# Patient Record
Sex: Female | Born: 1965 | Race: White | Hispanic: No | Marital: Married | Smoking: Never smoker
Health system: Southern US, Community
[De-identification: ages and names within clinical notes are randomized; demographics above are authoritative.]

## PROBLEM LIST (undated history)

## (undated) DIAGNOSIS — I10 Essential (primary) hypertension: Secondary | ICD-10-CM

## (undated) DIAGNOSIS — R112 Nausea with vomiting, unspecified: Secondary | ICD-10-CM

## (undated) DIAGNOSIS — K6389 Other specified diseases of intestine: Secondary | ICD-10-CM

## (undated) DIAGNOSIS — K219 Gastro-esophageal reflux disease without esophagitis: Secondary | ICD-10-CM

## (undated) DIAGNOSIS — I499 Cardiac arrhythmia, unspecified: Secondary | ICD-10-CM

## (undated) DIAGNOSIS — G43909 Migraine, unspecified, not intractable, without status migrainosus: Secondary | ICD-10-CM

## (undated) DIAGNOSIS — K5909 Other constipation: Secondary | ICD-10-CM

## (undated) DIAGNOSIS — I729 Aneurysm of unspecified site: Secondary | ICD-10-CM

## (undated) DIAGNOSIS — K648 Other hemorrhoids: Secondary | ICD-10-CM

## (undated) DIAGNOSIS — Z9889 Other specified postprocedural states: Secondary | ICD-10-CM

## (undated) HISTORY — PX: EYE SURGERY: SHX253

## (undated) HISTORY — DX: Aneurysm of unspecified site: I72.9

## (undated) HISTORY — PX: OTHER SURGICAL HISTORY: SHX169

## (undated) HISTORY — PX: COLONOSCOPY: SHX174

## (undated) HISTORY — DX: Gastro-esophageal reflux disease without esophagitis: K21.9

## (undated) HISTORY — PX: APPENDECTOMY: SHX54

## (undated) HISTORY — DX: Other constipation: K59.09

## (undated) HISTORY — DX: Migraine, unspecified, not intractable, without status migrainosus: G43.909

## (undated) HISTORY — PX: TUBAL LIGATION: SHX77

## (undated) HISTORY — DX: Other hemorrhoids: K64.8

## (undated) HISTORY — PX: UPPER GASTROINTESTINAL ENDOSCOPY: SHX188

---

## 2001-04-18 ENCOUNTER — Ambulatory Visit (HOSPITAL_COMMUNITY): Admission: RE | Admit: 2001-04-18 | Discharge: 2001-04-18 | Payer: Self-pay | Admitting: Internal Medicine

## 2001-04-18 ENCOUNTER — Encounter: Payer: Self-pay | Admitting: Internal Medicine

## 2001-04-25 ENCOUNTER — Encounter: Payer: Self-pay | Admitting: Internal Medicine

## 2001-04-25 ENCOUNTER — Ambulatory Visit (HOSPITAL_COMMUNITY): Admission: RE | Admit: 2001-04-25 | Discharge: 2001-04-25 | Payer: Self-pay | Admitting: Internal Medicine

## 2001-05-04 ENCOUNTER — Emergency Department (HOSPITAL_COMMUNITY): Admission: EM | Admit: 2001-05-04 | Discharge: 2001-05-04 | Payer: Self-pay | Admitting: Internal Medicine

## 2001-05-04 ENCOUNTER — Encounter: Payer: Self-pay | Admitting: Internal Medicine

## 2001-06-05 ENCOUNTER — Encounter: Payer: Self-pay | Admitting: Internal Medicine

## 2001-06-05 ENCOUNTER — Ambulatory Visit (HOSPITAL_COMMUNITY): Admission: RE | Admit: 2001-06-05 | Discharge: 2001-06-05 | Payer: Self-pay | Admitting: Internal Medicine

## 2001-06-22 ENCOUNTER — Ambulatory Visit (HOSPITAL_COMMUNITY): Admission: RE | Admit: 2001-06-22 | Discharge: 2001-06-22 | Payer: Self-pay | Admitting: Internal Medicine

## 2001-06-22 ENCOUNTER — Encounter: Payer: Self-pay | Admitting: Internal Medicine

## 2001-07-26 ENCOUNTER — Ambulatory Visit (HOSPITAL_COMMUNITY): Admission: RE | Admit: 2001-07-26 | Discharge: 2001-07-26 | Payer: Self-pay | Admitting: Specialist

## 2001-07-26 ENCOUNTER — Encounter: Payer: Self-pay | Admitting: Specialist

## 2001-07-26 ENCOUNTER — Other Ambulatory Visit: Admission: RE | Admit: 2001-07-26 | Discharge: 2001-07-26 | Payer: Self-pay | Admitting: Obstetrics and Gynecology

## 2002-02-01 ENCOUNTER — Observation Stay (HOSPITAL_COMMUNITY): Admission: RE | Admit: 2002-02-01 | Discharge: 2002-02-02 | Payer: Self-pay | Admitting: Obstetrics and Gynecology

## 2003-09-09 ENCOUNTER — Emergency Department (HOSPITAL_COMMUNITY): Admission: EM | Admit: 2003-09-09 | Discharge: 2003-09-09 | Payer: Self-pay | Admitting: Emergency Medicine

## 2004-09-25 ENCOUNTER — Ambulatory Visit (HOSPITAL_COMMUNITY): Admission: RE | Admit: 2004-09-25 | Discharge: 2004-09-25 | Payer: Self-pay | Admitting: Internal Medicine

## 2004-09-25 ENCOUNTER — Ambulatory Visit: Payer: Self-pay | Admitting: *Deleted

## 2004-10-26 ENCOUNTER — Ambulatory Visit (HOSPITAL_COMMUNITY): Admission: RE | Admit: 2004-10-26 | Discharge: 2004-10-27 | Payer: Self-pay | Admitting: Internal Medicine

## 2004-10-28 ENCOUNTER — Ambulatory Visit: Payer: Self-pay | Admitting: *Deleted

## 2005-02-26 ENCOUNTER — Ambulatory Visit (HOSPITAL_COMMUNITY): Admission: RE | Admit: 2005-02-26 | Discharge: 2005-02-26 | Payer: Self-pay | Admitting: Ophthalmology

## 2005-02-26 ENCOUNTER — Ambulatory Visit (HOSPITAL_BASED_OUTPATIENT_CLINIC_OR_DEPARTMENT_OTHER): Admission: RE | Admit: 2005-02-26 | Discharge: 2005-02-26 | Payer: Self-pay | Admitting: Ophthalmology

## 2006-12-21 ENCOUNTER — Encounter: Admission: RE | Admit: 2006-12-21 | Discharge: 2006-12-21 | Payer: Self-pay | Admitting: Occupational Medicine

## 2007-08-24 ENCOUNTER — Ambulatory Visit (HOSPITAL_COMMUNITY): Admission: RE | Admit: 2007-08-24 | Discharge: 2007-08-24 | Payer: Self-pay | Admitting: Internal Medicine

## 2007-10-06 ENCOUNTER — Encounter: Admission: RE | Admit: 2007-10-06 | Discharge: 2007-10-06 | Payer: Self-pay | Admitting: Neurology

## 2007-10-24 ENCOUNTER — Encounter: Payer: Self-pay | Admitting: Neurology

## 2007-11-02 ENCOUNTER — Ambulatory Visit (HOSPITAL_COMMUNITY): Admission: RE | Admit: 2007-11-02 | Discharge: 2007-11-02 | Payer: Self-pay | Admitting: Interventional Radiology

## 2007-11-17 ENCOUNTER — Encounter: Payer: Self-pay | Admitting: Interventional Radiology

## 2007-12-14 ENCOUNTER — Inpatient Hospital Stay (HOSPITAL_COMMUNITY): Admission: RE | Admit: 2007-12-14 | Discharge: 2007-12-15 | Payer: Self-pay | Admitting: Interventional Radiology

## 2007-12-28 ENCOUNTER — Encounter: Payer: Self-pay | Admitting: Interventional Radiology

## 2008-01-11 ENCOUNTER — Encounter: Admission: RE | Admit: 2008-01-11 | Discharge: 2008-01-11 | Payer: Self-pay | Admitting: Internal Medicine

## 2008-04-26 ENCOUNTER — Ambulatory Visit (HOSPITAL_COMMUNITY): Admission: RE | Admit: 2008-04-26 | Discharge: 2008-04-26 | Payer: Self-pay | Admitting: Interventional Radiology

## 2009-03-22 ENCOUNTER — Emergency Department (HOSPITAL_COMMUNITY): Admission: EM | Admit: 2009-03-22 | Discharge: 2009-03-22 | Payer: Self-pay | Admitting: Emergency Medicine

## 2009-03-28 ENCOUNTER — Ambulatory Visit (HOSPITAL_COMMUNITY): Admission: RE | Admit: 2009-03-28 | Discharge: 2009-03-28 | Payer: Self-pay | Admitting: Interventional Radiology

## 2009-09-02 ENCOUNTER — Encounter (INDEPENDENT_AMBULATORY_CARE_PROVIDER_SITE_OTHER): Payer: Self-pay | Admitting: *Deleted

## 2009-09-26 ENCOUNTER — Ambulatory Visit: Payer: Self-pay | Admitting: Gastroenterology

## 2009-09-26 ENCOUNTER — Encounter: Payer: Self-pay | Admitting: Gastroenterology

## 2009-09-26 DIAGNOSIS — K625 Hemorrhage of anus and rectum: Secondary | ICD-10-CM | POA: Insufficient documentation

## 2009-09-26 DIAGNOSIS — K5900 Constipation, unspecified: Secondary | ICD-10-CM | POA: Insufficient documentation

## 2009-10-01 ENCOUNTER — Encounter: Payer: Self-pay | Admitting: Gastroenterology

## 2009-10-14 ENCOUNTER — Ambulatory Visit: Payer: Self-pay | Admitting: Gastroenterology

## 2009-10-14 ENCOUNTER — Ambulatory Visit (HOSPITAL_COMMUNITY): Admission: RE | Admit: 2009-10-14 | Discharge: 2009-10-14 | Payer: Self-pay | Admitting: Gastroenterology

## 2009-11-18 ENCOUNTER — Ambulatory Visit: Payer: Self-pay | Admitting: Gastroenterology

## 2009-11-19 DIAGNOSIS — K649 Unspecified hemorrhoids: Secondary | ICD-10-CM | POA: Insufficient documentation

## 2009-12-08 ENCOUNTER — Encounter: Payer: Self-pay | Admitting: Urgent Care

## 2009-12-31 ENCOUNTER — Ambulatory Visit (HOSPITAL_COMMUNITY): Admission: RE | Admit: 2009-12-31 | Discharge: 2009-12-31 | Payer: Self-pay | Admitting: Internal Medicine

## 2010-01-06 ENCOUNTER — Encounter: Payer: Self-pay | Admitting: Interventional Radiology

## 2010-01-07 ENCOUNTER — Ambulatory Visit (HOSPITAL_COMMUNITY): Admission: RE | Admit: 2010-01-07 | Discharge: 2010-01-07 | Payer: Self-pay | Admitting: Interventional Radiology

## 2010-01-27 ENCOUNTER — Encounter: Admission: RE | Admit: 2010-01-27 | Discharge: 2010-01-27 | Payer: Self-pay | Admitting: Family Medicine

## 2010-04-09 ENCOUNTER — Ambulatory Visit (HOSPITAL_COMMUNITY): Admission: RE | Admit: 2010-04-09 | Discharge: 2010-04-09 | Payer: Self-pay | Admitting: Neurology

## 2010-07-13 ENCOUNTER — Ambulatory Visit (HOSPITAL_COMMUNITY): Admission: RE | Admit: 2010-07-13 | Discharge: 2010-07-13 | Payer: Self-pay | Admitting: Emergency Medicine

## 2010-07-20 ENCOUNTER — Telehealth (INDEPENDENT_AMBULATORY_CARE_PROVIDER_SITE_OTHER): Payer: Self-pay

## 2010-07-24 ENCOUNTER — Ambulatory Visit: Payer: Self-pay | Admitting: Gastroenterology

## 2010-07-24 ENCOUNTER — Encounter: Payer: Self-pay | Admitting: Gastroenterology

## 2010-07-24 DIAGNOSIS — R932 Abnormal findings on diagnostic imaging of liver and biliary tract: Secondary | ICD-10-CM | POA: Insufficient documentation

## 2010-07-24 DIAGNOSIS — R109 Unspecified abdominal pain: Secondary | ICD-10-CM | POA: Insufficient documentation

## 2010-07-24 DIAGNOSIS — D1803 Hemangioma of intra-abdominal structures: Secondary | ICD-10-CM | POA: Insufficient documentation

## 2010-07-28 ENCOUNTER — Ambulatory Visit (HOSPITAL_COMMUNITY): Admission: RE | Admit: 2010-07-28 | Discharge: 2010-07-28 | Payer: Self-pay | Admitting: Gastroenterology

## 2010-07-30 LAB — CONVERTED CEMR LAB
ALT: 20 units/L (ref 0–35)
AST: 15 units/L (ref 0–37)
Basophils Relative: 0 % (ref 0–1)
Bilirubin, Direct: 0.1 mg/dL (ref 0.0–0.3)
Creatinine, Ser: 0.8 mg/dL (ref 0.40–1.20)
Eosinophils Absolute: 0.1 10*3/uL (ref 0.0–0.7)
Hemoglobin: 13.3 g/dL (ref 12.0–15.0)
Indirect Bilirubin: 0.3 mg/dL (ref 0.0–0.9)
Lymphs Abs: 1.2 10*3/uL (ref 0.7–4.0)
MCHC: 32.5 g/dL (ref 30.0–36.0)
MCV: 92.1 fL (ref 78.0–100.0)
Monocytes Absolute: 0.5 10*3/uL (ref 0.1–1.0)
Monocytes Relative: 8 % (ref 3–12)
Neutrophils Relative %: 70 % (ref 43–77)
RBC: 4.44 M/uL (ref 3.87–5.11)
Total Protein: 6.8 g/dL (ref 6.0–8.3)
WBC: 6.2 10*3/uL (ref 4.0–10.5)

## 2010-07-31 ENCOUNTER — Encounter: Payer: Self-pay | Admitting: Gastroenterology

## 2010-08-07 ENCOUNTER — Ambulatory Visit (HOSPITAL_COMMUNITY): Admission: RE | Admit: 2010-08-07 | Discharge: 2010-08-07 | Payer: Self-pay | Admitting: Gastroenterology

## 2010-08-07 ENCOUNTER — Ambulatory Visit: Payer: Self-pay | Admitting: Gastroenterology

## 2010-08-14 ENCOUNTER — Telehealth (INDEPENDENT_AMBULATORY_CARE_PROVIDER_SITE_OTHER): Payer: Self-pay

## 2010-11-17 NOTE — Letter (Signed)
Summary: EGD ORDER  EGD ORDER   Imported By: Ave Filter 07/31/2010 08:47:46  _____________________________________________________________________  External Attachment:    Type:   Image     Comment:   External Document

## 2010-11-17 NOTE — Assessment & Plan Note (Signed)
Summary: ABD PAIN,ABD DISTENTION,GASTRITIS,HEPATIC LESION/CM   Visit Type:  consult Referring Provider:  [facility] in [place] Primary Care Provider:  Carylon Perches  Chief Complaint:  abd pain and swelling x 2 weeks.  History of Present Illness: [name] is a pleasant 45 y/o female, established patient, who presents at request of [facility] for further evaluation of abd pain and abnormal abd u/s.   Started having epigastric pain few weeks ago when on steroids for ear infection. When completed, abd pain continued. Went to urgent care. Started on Prilosec and Carafate. Then added Bentyl and Ranitidine. When takes medications, feels little better, but watching everything she eats, bland soft diet. Abd pain worse with meals, greasy foods, dairy products. Some nausea. Was having bad acid reflux, burning into throat, regurgitation but all better on new meds. Some belching. BM regular on fiber supplement (daily) and Miralax (most days). Weight down 3-4 pounds in last week.   Abd U/S 9/11-->abnormal gb with mildly thickened wall and either septations or redundant mucosa, with similar findings on 2002 exam. 3.8X2.7X4.5cm lobulated hyperechoic mass at posterior aspect right lobe liver, ?hemangioma, but significant increase in size vs similar lesion described on report from 2002 exam. Radiologist recommends MRI liver/abd with and without contrast. Labs 07/11/10: lipase 160H, amylase 42, Tbili 0.5, AP 72, AST 23.  Current Medications (verified): 1)  Amitriptyline Hcl 25 Mg Tabs (Amitriptyline Hcl) .... Qid 2)  Topamax 25 Mg Tabs (Topiramate) .... Take 3 Tablets Daily 3)  Asa 81 Mg .... One Tablet Every Other Day 4)  Prilosec 20 Mg Cpdr (Omeprazole) .... Two Times A Day 5)  Ranitidine Hcl 150 Mg Caps (Ranitidine Hcl) .... Two Times A Day 6)  Bentyl 10 Mg Caps (Dicyclomine Hcl) .... Q 6 Hours As Needed 7)  Carafate 1 Gm/70ml Susp (Sucralfate) .... 2 Tsp Qid 8)  Flonase 50  Mcg/act Susp (Fluticasone Propionate) .... At Night 9)  Polyethylene Glycol 3350  Powd (Polyethylene Glycol 3350) .Marland KitchenMarland KitchenMarland Kitchen 17 Grams By Mouth Daily 10)  Fiber Supplement .... Once Daily  Allergies (verified): No Known Drug Allergies  Past History:  Past Medical History: Migraines/SINUS HA Aneurysm-s/p coiling 2009 TCS/TI, 12/10--> Normal terminal ileum approximately 25 cm visualized. Tortuous colon.  Moderate internal hemorrhoids.   Past Surgical History: Tubal Ligation Appendectomy at same time of tubal ligation  Family History: No FH of Colon Cancer, IBD or polyps. Has a cousin with colitis.  Family History of Breast Cancer: cousin Family History of Ovarian Cancer: no Family History of Uterine Cancer: no No FH of liver disease. Mother and sister had cholecystectomy.  Social History: Reviewed history from 09/26/2009 and no changes required. Married: 32 y, 1 kid: 9 daughter Occupation: Technical brewer Patient has never smoked.  Alcohol Use - very, very rare No recreational drug use  Review of Systems General:  Complains of weight loss; denies fever, chills, sweats, anorexia, fatigue, and weakness. Eyes:  Denies vision loss. ENT:  Denies nasal congestion, sore throat, hoarseness, and difficulty swallowing. CV:  Denies chest pains, angina, palpitations, dyspnea on exertion, and peripheral edema. Resp:  Denies dyspnea at rest, dyspnea with exercise, cough, and sputum. GI:  See HPI. GU:  Denies urinary burning and blood in urine. MS:  Denies joint pain / LOM. Derm:  Denies rash and itching. Neuro:  Denies weakness, frequent headaches, memory loss, and confusion. Psych:  Denies depression and anxiety. Endo:  Denies unusual weight change. Heme:  Denies bruising and bleeding. Allergy:  Denies  hives and rash.  Vital Signs:  Patient profile:   45 year old female Height:      65 inches Weight:      159 pounds BMI:     26.55 Temp:     98.5 degrees F oral Pulse rate:   80 /  minute BP sitting:   104 / 72  (left arm) Cuff size:   regular  Vitals Entered By: Hendricks Limes LPN (July 24, 2010 11:40 AM)  Physical Exam  General:  Well developed, well nourished, no acute distress. Head:  Normocephalic and atraumatic. Eyes:  Conjunctivae pink, no scleral icterus.  Mouth:  Oropharyngeal mucosa moist, pink.  No lesions, erythema or exudate.    Neck:  Supple; no masses or thyromegaly. Lungs:  Clear throughout to auscultation. Heart:  Regular rate and rhythm; no murmurs, rubs,  or bruits. Abdomen:  Soft. Positive BS. Moderate epigastric tenderness. No rebound or guarding. No HSM or masses. NO abd bruit or hernia.  Extremities:  No clubbing, cyanosis, edema or deformities noted. Neurologic:  Alert and  oriented x4;  grossly normal neurologically. Skin:  Intact without significant lesions or rashes. Cervical Nodes:  No significant cervical adenopathy. Psych:  Alert and cooperative. Normal mood and affect.  Impression & Recommendations:  Problem # 1:  ABDOMINAL PAIN (ICD-789.00)  ?biliary pancreatitis given bump in lipase and ?gb sludge on abd u/s. She has abnormal but potentially stable appearing gb based on report from abd u/s in 2002. At that time, she also had CT and HIDA and nothing conclusive to warrant cholecystectomy. Abd pain could be secondary to gastritis or PUD from steroids but would suspect that to be better on PPI.   Recommend labs, including lipase, amylase, lfts, cbc. Will look at gb further at time of MRI which is being done to look at liver lesion. Also have biliary system evaluated.  Continue Bentyl for now.   Orders: T-Lipase [id number]) T-Amylase [id number]) T-CBC w/Diff [id number]) T-Hepatic Function [id number])  Problem # 2:  LIVER MASS (ICD-235.3)  Liver lesion dating back to at least 2002. At that time, CT showed hemangioma measuring 2cm. Doubled in size now. Per radiologist's recommendation with obtain MRI liver/abd.    Orders: Consultation Level III [id number])  Other Orders: T-Creatinine Blood ([id number]) Prescriptions: BENTYL 10 MG CAPS (DICYCLOMINE HCL) 1-2 every six hours as needed abd pain  #120 x 0   Entered and Authorized by:   Leanna Battles. Dixon Boos   Signed by:   Leanna Battles Lewis PA-C on 07/24/2010   Method used:   Electronically to        [facility]  [address] (retail)       [address]       [place], [place]  [address]       Ph: [phone]       Fax: [phone]   RxID:   [id number]  I would like to thank [facility] for allowing [place] to take part in the care of this nice patient.   Appended Document: ABD PAIN,ABD DISTENTION,GASTRITIS,HEPATIC LESION/CM SEP 2011 159 LBS

## 2010-11-17 NOTE — Medication Information (Signed)
Summary: Tax adviser   Imported By: Diana Eves 12/08/2009 08:55:52  _____________________________________________________________________  External Attachment:    Type:   Image     Comment:   External Document  Appended Document: RX Folder Let pharm know anucort ok please.  Appended Document: RX Folder Informed Annabelle Harman at the pharmacy.

## 2010-11-17 NOTE — Assessment & Plan Note (Signed)
Summary: RECTAL BLEEDING/CONSTIPATION   Visit Type:  Follow-up Visit Primary Care Provider:  Ouida Sills, M.D.  Chief Complaint:  F/U rectal bleeding/constipation.  History of Present Illness: Pt not taking meds regularly. Getting under control then treated for sinus infection and got a yeast infection. Bleeding from rectum: 1-2x/week. Not gobbs just on the tissue. BM:1-2x/sday. Going every day with two times a day fiber.    Current Medications (verified): 1)  Amitriptyline Hcl 25 Mg Tabs (Amitriptyline Hcl) .... Take 1 Tablet By Mouth Once A Day 2)  Topamax 25 Mg Tabs (Topiramate) .... Take Two Tablets Daily 3)  Asa 81 Mg .... One Tablet Every Other Day 4)  Anucort-Hc 25 Mg Supp (Hydrocortisone Acetate) .... One Pr Every 12 Hours For 14 Days  Allergies (verified): No Known Drug Allergies  Past History:  Past Medical History: Last updated: 09/26/2009 Migraines/SINUS HA Aneurysm-s/p coiling 2009  Past Surgical History: Last updated: 09/26/2009 Tubal Ligation  Vital Signs:  Patient profile:   45 year old female Height:      65 inches Weight:      152 pounds BMI:     25.39 Temp:     98.1 degrees F oral Pulse rate:   64 / minute BP sitting:   120 / 82  (left arm) Cuff size:   regular  Vitals Entered By: Cloria Spring LPN (November 18, 2009 4:14 PM)  Physical Exam  General:  Well developed, well nourished, no acute distress. Head:  Normocephalic and atraumatic. Lungs:  Clear throughout to auscultation. Heart:  Regular rate and rhythm; no murmurs Abdomen:  Soft, nontender and nondistended. Normal bowel sounds. Extremities:  No cyanosis, edema or deformities noted. Neurologic:  Alert and  oriented x4;  grossly normal neurologically.  Impression & Recommendations:  Problem # 1:  CONSTIPATION (ICD-564.00) Assessment Improved Discussed TCS result. Take MVI daily-[place] chewable vitamins.  Problem # 2:  RECTAL BLEEDING (ICD-569.3) Assessment: Improved but persists.  Anusol HC for 14 days. Call me in one month. If not, Dr. Cherylann Ratel for hemorrhoidectomy. Pt declined to address via endoscopy. OPV in 6 mos.  CC: PCP  Other Orders: Est. Patient Level III ([address])

## 2010-11-17 NOTE — Progress Notes (Signed)
Summary: phone note/ wants earlier appt.  Phone Note From Other Clinic   Caller: Lupita Leash @ [facility] [address] Summary of Call: Received a phone call from Lupita Leash at the [facility] in [place]. She said pt has appt here on 07/29/2010. Was referred for hepatic lesions and gastritis. She is having some complications and wants to be seen this Friday, 07/24/2010. Lupita Leash can be reached at [phone] I called pt's home and was told she was out of town, so i called her cell [phone] and she is in [place]. Got stranded. was going on a  cruise but was told she should not board a boat, and her friends whom they had ridden together went ahead and went on the cruise. She will return late [employer] and could come in Fri and sure would like to be seen then.  ( She is aware if she worsens to go to the hosp) She said she was told that her GB was full of sludge and she had elevated tests indicating problems with Pancreas). please advise! Initial call taken by: Cloria Spring LPN,  July 20, 2010 1:54 PM     Appended Document: phone note/ wants earlier appt. OK to schedule for Friday at 11:30 10/7th. I need all records including labs PRIOR to her appt. Thanks.  Appended Document: phone note/ wants earlier appt. Pt informed of the appt. I asked he rto be here 15 min early and bring her meds or list and her insurance card.  Appended Document: phone note/ wants earlier appt. I called [email or web address]@ [facility] and told her that we have moved up the appointment for the patient and will need any current labs faxed to [place] prior to OV.

## 2010-11-17 NOTE — Progress Notes (Signed)
Summary: phone note/ questions about new medication  Phone Note Call from Patient   Caller: Patient Summary of Call: Pt called and said she started taking the new medicine, Ketoconazole. She did read in the material that if you or family member has had a HX of Long QT Syndrome to discuss the use with physician. She said her mother had a yeast infection around her heart about 7 years ago and took Diflucan and then shortly afterwards was dx with Long QT Syndrome. Pt would like a call from Dr. Darrick Penna to discuss.She can be reached at [phone]. Initial call taken by: Cloria Spring LPN,  August 14, 2010 11:51 AM     Appended Document: phone note/ questions about new medication Spoke with pt. She had an EKG 7 years ago and no prolnged QT. Recommended pt have repeat EKG and if no QT prolongation start the ketoconazole. Pt will make an appt with Dr. Ouida Sills.  Appended Document: phone note/ questions about new medication OPV IN APR 2012, Dx: abd pain, Candida esophagitis.  Appended Document: phone note/ questions about new medication reminder in computer

## 2010-11-17 NOTE — Letter (Signed)
Summary: MRI/MRC ORDER  MRI/MRC ORDER   Imported By: Ave Filter 07/24/2010 12:44:07  _____________________________________________________________________  External Attachment:    Type:   Image     Comment:   External Document

## 2010-11-27 ENCOUNTER — Encounter (INDEPENDENT_AMBULATORY_CARE_PROVIDER_SITE_OTHER): Payer: Self-pay | Admitting: *Deleted

## 2010-12-03 NOTE — Letter (Signed)
Summary: Recall Office Visit  [facility]  [address]   [place], [place] [address]   Phone: [phone]  Fax: [phone]      November 27, 2010   [name] [address] [place], [place]  [address] 10-20-1965   Dear [name],   According to our records, it is time for you to schedule a follow-up office visit with [place].   At your convenience, please call [phone] to schedule an office visit. If you have any questions, concerns, or feel that this letter is in error, we would appreciate your call.   Sincerely,    Diana Eves  [facility] Ph: [phone]   Fax: [phone]

## 2010-12-17 HISTORY — PX: GLAUCOMA SURGERY: SHX656

## 2010-12-30 LAB — KOH PREP

## 2011-01-25 LAB — BASIC METABOLIC PANEL
BUN: 8 mg/dL (ref 6–23)
CO2: 23 mEq/L (ref 19–32)
Chloride: 109 mEq/L (ref 96–112)
Creatinine, Ser: 0.69 mg/dL (ref 0.4–1.2)
Glucose, Bld: 106 mg/dL — ABNORMAL HIGH (ref 70–99)
Potassium: 3.6 mEq/L (ref 3.5–5.1)

## 2011-01-25 LAB — WET PREP, GENITAL
Trich, Wet Prep: NONE SEEN
Yeast Wet Prep HPF POC: NONE SEEN

## 2011-01-25 LAB — CBC
HCT: 36.8 % (ref 36.0–46.0)
MCHC: 33.3 g/dL (ref 30.0–36.0)
MCV: 86.1 fL (ref 78.0–100.0)
Platelets: 305 10*3/uL (ref 150–400)
RDW: 13.8 % (ref 11.5–15.5)

## 2011-01-28 ENCOUNTER — Encounter: Payer: Self-pay | Admitting: Gastroenterology

## 2011-01-28 ENCOUNTER — Ambulatory Visit (INDEPENDENT_AMBULATORY_CARE_PROVIDER_SITE_OTHER): Payer: Self-pay | Admitting: Gastroenterology

## 2011-01-28 DIAGNOSIS — K5900 Constipation, unspecified: Secondary | ICD-10-CM

## 2011-01-28 DIAGNOSIS — R1310 Dysphagia, unspecified: Secondary | ICD-10-CM | POA: Insufficient documentation

## 2011-01-28 DIAGNOSIS — K219 Gastro-esophageal reflux disease without esophagitis: Secondary | ICD-10-CM

## 2011-01-28 DIAGNOSIS — K625 Hemorrhage of anus and rectum: Secondary | ICD-10-CM

## 2011-01-28 DIAGNOSIS — K2970 Gastritis, unspecified, without bleeding: Secondary | ICD-10-CM

## 2011-01-28 MED ORDER — OMEPRAZOLE 20 MG PO CPDR: DELAYED_RELEASE_CAPSULE | ORAL | Status: DC

## 2011-01-28 NOTE — Progress Notes (Signed)
  Subjective:    Patient ID: [name], female    DOB: Mar 11, 1966, 45 y.o.   MRN: [id number]  HPI  Eating some vegtables with cheese. 10 fiber pills a day & Miralax. Goes every day. OMP helps with reflux. Swallowing problems: couple times a week. Better after dilation in OCT 2011 and now more problems swallowing. If on Prilosec breakthrough once a month. If off of it for 2-3 days, then had to take TUMs. Gained 4 lbs since OCT 2011. Gets nauseous AND NEEDS PHENERGAN PRIOR TO PROCEDURES.  Past Medical History  Diagnosis Date  . Glaucoma   . Chronic constipation   . GERD (gastroesophageal reflux disease)   . Hemorrhoids, internal, with bleeding DEC 2010 TCS  . Migraines   . Aneurysm 2009 COILING  . Abdominal pain OCT 2011 NL CBC, HFP, LIPASE    MRCP-SEPTATED GB, NL CBD, R LOBE HEMANGIOMA   Past Surgical History  Procedure Date  . Glaucoma surgery MAR 2012  . Colonoscopy     DEC 2010 IH  . Tubal ligation   . Upper gastrointestinal endoscopy     OCT 2011 RING DILATED TO , CANDIDA ESOPHAGITIS, CHRONIC GASTRITIS    Review of Systems DEC 2010 150 LBS OCT 2011 159 LBS    Objective:   Physical Exam  Constitutional: She is oriented to person, place, and time. She appears well-developed and well-nourished.  HENT:  Head: Normocephalic and atraumatic.  Eyes: Conjunctivae are normal.       LEFT PUPIL LARGER THAN RIGHT PUPIL  Cardiovascular: Normal rate and regular rhythm.   Pulmonary/Chest: Effort normal and breath sounds normal.  Abdominal: Soft. Bowel sounds are normal.  Musculoskeletal: She exhibits no edema.  Neurological: She is alert and oriented to person, place, and time.          Assessment & Plan:

## 2011-01-28 NOTE — Assessment & Plan Note (Signed)
LIKELY 2oTO IBS-C. SX IMPROVED. Continue Miralax and fiber.

## 2011-01-28 NOTE — Patient Instructions (Signed)
Upper endoscopy for dilation within the next 2-3 weeks.  Continue Omeprazole (Prilosec) and take 30 minutes prior to your first meal. Follow a low fat diet. See HO below. Follow up in 4 mos.  Low-Fat, Low-Saturated-Fat, Low-Cholesterol Diets Food Selection Guide BREADS, CEREALS, PASTA, RICE, DRIED PEAS, AND BEANS These products are high in carbohydrates and most are low in fat. Therefore, they can be increased in the diet as substitutes for fatty foods. They too, however, contain calories and should not be eaten in excess. Cereals can be eaten for snacks as well as for breakfast.  Include foods that contain fiber (fruits, vegetables, whole grains, and legumes). Research shows that fiber may lower blood cholesterol levels, especially the water-soluble fiber found in fruits, vegetables, oat products, and legumes. FRUITS AND VEGETABLES It is good to eat fruits and vegetables. Besides being sources of fiber, both are rich in vitamins and some minerals. They help you get the daily allowances of these nutrients. Fruits and vegetables can be used for snacks and desserts. MEATS Limit lean meat, chicken, [place], and fish to no more than 6 ounces per day. Beef, Pork, and Lamb  Use lean cuts of beef, pork, and lamb. Lean cuts include:   Extra-lean ground beef.   Arm roast.   Sirloin tip.   Center-cut ham.   Round steak.   Loin chops.   Rump roast.   Tenderloin.  Trim all fat off the outside of meats before cooking. It is not necessary to severely decrease the intake of red meat, but lean choices should be made. Lean meat is rich in protein and contains a highly absorbable form of iron. Premenopausal women, in particular, should avoid reducing lean red meat because this could increase the risk for low red blood cells (iron-deficiency anemia). Processed Meats Processed meats, such as bacon, bologna, salami, sausage, and hot dogs contain large quantities of fat, are not rich in valuable  nutrients, and should not be eaten very often. Organ Meats The organ meats, such as liver, sweetbreads, kidneys, and brain are very rich in cholesterol. They should be limited. Chicken and [place] These are good sources of protein. The fat of poultry can be reduced by removing the skin and underlying fat layers before cooking. Chicken and [place] can be substituted for lean red meat in the diet. Poultry should not be fried or covered with high-fat sauces. Fish and Shellfish Fish is a good source of protein. Shellfish contain cholesterol, but they usually are low in saturated fatty acids. The preparation of fish is important. Like chicken and [place], they should not be fried or covered with high-fat sauces. EGGS Egg yolks often are hidden in cooked and processed foods. Egg whites contain no fat or cholesterol. They can be eaten often. Try 1 to 2 egg whites instead of whole eggs in recipes or use egg substitutes that do not contain yolk. MILK AND DAIRY PRODUCTS Use skim or 1% milk instead of 2% or whole milk. Decrease whole milk, natural, and processed cheeses. Use nonfat or low-fat (2%) cottage cheese or low-fat cheeses made from vegetable oils. Choose nonfat or low-fat (1 to 2%) yogurt. Experiment with evaporated skim milk in recipes that call for heavy cream. Substitute low-fat yogurt or low-fat cottage cheese for sour cream in dips and salad dressings. Have at least 2 servings of low-fat dairy products, such as 2 glasses of skim (or 1%) milk each day to help get your daily calcium intake. FATS AND OILS Reduce the total intake of  fats, especially saturated fat. Butterfat, lard, and beef fats are high in saturated fat and cholesterol. These should be avoided as much as possible. Vegetable fats do not contain cholesterol, but certain vegetable fats, such as coconut oil, palm oil, and palm kernel oil are very high in saturated fats. These should be limited. These fats are often used in bakery goods,  processed foods, popcorn, oils, and nondairy creamers. Vegetable shortenings and some peanut butters contain hydrogenated oils, which are also saturated fats. Read the labels on these foods and check for saturated vegetable oils. Unsaturated vegetable oils and fats do not raise blood cholesterol. However, they should be limited because they are fats and are high in calories. Total fat should still be limited to 30% of your daily caloric intake. Desirable liquid vegetable oils are corn oil, cottonseed oil, olive oil, canola oil, safflower oil, soybean oil, and sunflower oil. Peanut oil is not as good, but small amounts are acceptable. Buy a heart-healthy tub margarine that has no partially hydrogenated oils in the ingredients. Mayonnaise and salad dressings often are made from unsaturated fats, but they should also be limited because of their high calorie and fat content. Seeds, nuts, peanut butter, olives, and avocados are high in fat, but the fat is mainly the unsaturated type. These foods should be limited mainly to avoid excess calories and fat. OTHER EATING TIPS Snacks  Most sweets should be limited as snacks. They tend to be rich in calories and fats, and their caloric content outweighs their nutritional value. Some good choices in snacks are graham crackers, melba toast, soda crackers, bagels (no egg), English muffins, fruits, and vegetables. These snacks are preferable to snack crackers, [place] fries, and chips. Popcorn should be air-popped or cooked in small amounts of liquid vegetable oil. Desserts Eat fruit, low-fat yogurt, and fruit ices instead of pastries, cake, and cookies. Sherbet, angel food cake, gelatin dessert, frozen low-fat yogurt, or other frozen products that do not contain saturated fat (pure fruit juice bars, frozen ice pops) are also acceptable.  COOKING METHODS Choose those methods that use little or no fat. They include:  Poaching.   Braising.   Steaming.   Grilling.    Baking.   Stir-frying.   Broiling.   Microwaving.  Foods can be cooked in a nonstick pan without added fat, or use a nonfat cooking spray in regular cookware. Limit fried foods and avoid frying in saturated fat. Add moisture to lean meats by using water, broth, cooking wines, and other nonfat or low-fat sauces along with the cooking methods mentioned above. Soups and stews should be chilled after cooking. The fat that forms on top after a few hours in the refrigerator should be skimmed off. When preparing meals, avoid using excess salt. Salt can contribute to raising blood pressure in some people. EATING AWAY FROM HOME Order entres, potatoes, and vegetables without sauces or butter. When meat exceeds the size of a deck of cards (3 to 4 ounces), the rest can be taken home for another meal. Choose vegetable or fruit salads and ask for low-calorie salad dressings to be served on the side. Use dressings sparingly. Limit high-fat toppings, such as bacon, crumbled eggs, cheese, sunflower seeds, and olives. Ask for heart-healthy tub margarine instead of butter. Document Released: 03/26/2002 Document Re-Released: 12/29/2009 [facility] Patient Information 2011 [place], [place].

## 2011-01-28 NOTE — Assessment & Plan Note (Signed)
Resolved

## 2011-01-28 NOTE — Progress Notes (Signed)
Pt is aware of OV for 06/02/11 @ 0830 with SF

## 2011-01-28 NOTE — Assessment & Plan Note (Signed)
Improved initially now having problems swallowing. Upper endoscopy for dilation within the next 2-3 weeks.  Follow up in 4 mos.

## 2011-01-28 NOTE — Assessment & Plan Note (Signed)
Sx controlled with PPI. Discussed theoretical side effect of low calcium and magnesium. Continue Omeprazole (Prilosec) and take 30 minutes prior to your first meal. Follow a low fat diet. See HO below.

## 2011-02-09 ENCOUNTER — Encounter: Payer: Managed Care, Other (non HMO) | Admitting: Gastroenterology

## 2011-02-09 ENCOUNTER — Ambulatory Visit (HOSPITAL_COMMUNITY)
Admission: RE | Admit: 2011-02-09 | Discharge: 2011-02-09 | Disposition: A | Discharge status: Home / Self Care | Payer: Managed Care, Other (non HMO) | Source: Ambulatory Visit | Attending: Gastroenterology | Admitting: Gastroenterology

## 2011-02-09 DIAGNOSIS — B3781 Candidal esophagitis: Secondary | ICD-10-CM | POA: Insufficient documentation

## 2011-02-09 DIAGNOSIS — K222 Esophageal obstruction: Secondary | ICD-10-CM | POA: Insufficient documentation

## 2011-02-09 DIAGNOSIS — K5732 Diverticulitis of large intestine without perforation or abscess without bleeding: Secondary | ICD-10-CM | POA: Insufficient documentation

## 2011-02-09 DIAGNOSIS — R1310 Dysphagia, unspecified: Secondary | ICD-10-CM

## 2011-02-09 DIAGNOSIS — K5710 Diverticulosis of small intestine without perforation or abscess without bleeding: Secondary | ICD-10-CM

## 2011-02-13 NOTE — Op Note (Addendum)
NAMEBELKY, Bethany              ACCOUNT NO.:  [id number]  MEDICAL RECORD NO.:  [id number]           PATIENT TYPE:  O  LOCATION:  DAYP                          FACILITY:  [facility]  PHYSICIAN:  Kingsley Callander. Ouida Sills, MD       DATE OF BIRTH:  1965/12/01  DATE OF PROCEDURE:  02/09/2011 DATE OF DISCHARGE:                               PROCEDURE NOTE   REFERRING PHYSICIAN:  Kingsley Callander. Ouida Sills, MD  PROCEDURE:  Esophagogastroduodenoscopy with Savary dilation to 17 mm.  INDICATION FOR EXAM:  [name] is a 45 year old female who has a history of gastroesophageal reflux disease.  She presented with solid dysphagia in October 2011, was dilated to 16 mm.  She also had Candida esophagitis which is treated with ketoconazole.  She had symptomatic relief with the dilation but her symptoms returned at her visit in April 2012, she was having solid dysphagia 2-3 times a week.  FINDINGS: 1. Distal esophageal ring which narrowed the lumen to approximately 14-     15 mm.  The esophagus was dilated to 17 mm.  Initially, the 14-mm     dilator passed with moderate resistance and the 15 through 17 mm     dilator passed with mild resistance. 2. Diffuse erythema in the stomach with edema, but no erosions or     ulcerations. 3. Normal duodenal bulb.  Two diverticula in the second portion of the     duodenum.  DIAGNOSES: 1. 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 2. Candida esophagitis, resolved. 3. Duodenal diverticula.  RECOMMENDATIONS: 1. Follow up in 4 months regarding her dysphagia.  If her symptoms     persists, then she should have a barium swallow to evaluate for     esophageal motility disorder to include achalasia. 2. She may resume her previous diet. 3. She should continue to take omeprazole 20 mg 30 minutes before her     first meal.  MEDICATIONS: 1. Demerol 75 mg IV. 2. Versed 3 mg IV. 3. Phenergan 6.25 mg IV.  PROCEDURE TECHNIQUE:  Physical exam was performed.  Informed  consent was obtained from the patient after explaining the benefits, risks, and alternatives to the procedure.  The patient was connected to the monitor and placed in the left lateral position.  Continuous oxygen was provided by nasal cannula and IV medicine was administered through an indwelling cannula.  After administration of sedation, the patient's esophagus was intubated.  The scope was advanced under direct visualization to the second portion of the duodenum.  The scope was removed slowly by carefully examining the color, texture, anatomy, and integrity of the mucosa on the way out.  Prior to withdrawal of the scope, retroflex view of cardia was performed.  The Savary wire was introduced.  The esophagus was dilated from 14 to 17 mm.  The guidewire and the dilator were removed.  The patient was recovered in an endoscopy and discharged home in satisfactory condition.     Jonette Eva, M.D.   ______________________________ Kingsley Callander. Ouida Sills, MD    SF/MEDQ  D:  02/09/2011  T:  02/09/2011  Job:  [id number]  cc:   Kingsley Callander. Ouida Sills, MD Fax: [phone]  Electronically Signed by Carylon Perches MD on 02/13/2011 11:09:45 AM Electronically Signed by Jonette Eva M.D. on 03/16/2011 02:15:18 PM

## 2011-03-01 ENCOUNTER — Encounter: Payer: Self-pay | Admitting: Gastroenterology

## 2011-03-02 NOTE — Consult Note (Signed)
[name], [name]              ACCOUNT NO.:  [id number]   MEDICAL RECORD NO.:  [id number]          PATIENT TYPE:  OUT   LOCATION:  XRAY                         FACILITY:  [facility]   PHYSICIAN:  [name], M.D.DATE OF BIRTH:  03-Jan-1966   DATE OF CONSULTATION:  12/28/2007  DATE OF DISCHARGE:                                 CONSULTATION   CHIEF COMPLAINT:  Status post stent-assisted coiling of a left internal  carotid artery aneurysm performed December 14, 2007.   BRIEF HISTORY:  This is a very pleasant 45 year old female who was  referred to Dr. Avie Armstrong from her primary physician, Dr. Carylon Armstrong in  [place], for evaluation of headaches.  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  Dr.  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  The  patient  subsequently underwent stent-assisted coiling of the aneurysm performed  December 14, 2007 without any immediate or known complications.  She  returns today accompanied by her husband to be seen in followup.   PAST MEDICAL HISTORY:  Significant for migraine headaches,  gastroesophageal reflux disease, occasional sinus problems, a history of  an essential tremor.  Per [name], she has had a 2-D echo in the past.  We do not have the results of that study.  She has a history of  endometriosis.   SURGICAL HISTORY:  The patient has had eye surgery and she still has  somewhat disconjugate eye movements.  She denies diplopia.  She had an  appendectomy in 2003.  She had a tubal ligation in 2003.  She reports  nausea associated with anesthesia.   ALLERGIES:  CODEINE CAUSES NAUSEA.  SHE DOES NOT FEEL THAT SHE TOLERATES  PAIN MEDICATIONS VERY WELL.  SHE DEVELOPS A RASH WITH ADHESIVES.   MEDICATIONS:  Include Topamax, Elavil, aspirin and Plavix.   SOCIAL HISTORY:  The patient is married.  She and her  husband live in  [place], [place].  They have a 21 year old daughter.  The patient  has never smoked.  She uses alcohol rarely.  She works in a Therapist, occupational.   FAMILY HISTORY:  The patient's father is alive and well at age 47.  Her  mother is age 28.  She has heart disease and a history of migraine  headaches.  She has two sisters, one sister who has migraine headaches.  She has a grandfather who died from a cerebral hemorrhage.  She had an  aunt with an abdominal aortic aneurysm.   IMPRESSION AND PLAN:  As noted, the patient returns today to be seen in  followup after stent- assisted coiling of her left internal carotid  artery aneurysm performed December 14, 2007.  The patient reports she  has been doing well.  She occasionally has some odd feelings in the left  side of her head.  She reports that it is an occasional mild pain and  occasionally it feels like something is moving inside of her head.  Dr.  Corliss Armstrong reassured the patient that nothing is moving and these  sensations should resolve on their own.  They are not unexpected.  She  also reports occasional lights flashing in her vision although she has  not had any migraine headaches since the stent-assisted coiling.   The patient continues on aspirin 81 mg daily and Plavix 75 mg daily.  Dr. Corliss Armstrong has recommended she increase her aspirin to 325 mg daily.  She is to finish out her current prescription of Plavix, which consists  of about a 2-week supply, taking 1 daily.  After that, she is to refill  the prescription and change the Plavix to every other day for 1 month  and then stop the Plavix.  She is to continue on the aspirin 325 mg  until her repeat angiogram in approximately 3 to 4 months.   Greater than 15 minutes were spent on this consult.      [name], P.A.    ______________________________  [name], M.D.    DR/MEDQ  D:  12/28/2007  T:  12/29/2007  Job:  [id number]   cc:    Bethany Callander. Ouida Sills, MD  Bethany Lacks, MD

## 2011-03-02 NOTE — Consult Note (Signed)
[name], [name]              ACCOUNT NO.:  [id number]   MEDICAL RECORD NO.:  [id number]          PATIENT TYPE:  OUT   LOCATION:  XRAY                         FACILITY:  [facility]   PHYSICIAN:  Sanjeev K. Deveshwar, M.D.DATE OF BIRTH:  May 07, 1966   DATE OF CONSULTATION:  10/24/2007  DATE OF DISCHARGE:                                 CONSULTATION   DATE OF CONSULTATION:  October 24, 2007.   The phone system went dead when I was dictating this consult and it cut  off right after the  social history.  I would like to continue at that point.  Please see  that the two portions of this dictation are put together so it makes a  cohesive report.   SOCIAL HISTORY:  The patient works in Technical brewer at [employer].   FAMILY HISTORY:  The patient's father is alive and well.  He accompanies  the patient to this visit today.  He is age 47 and her mother is age 47.  She has heart disease and history of migraine headaches.  She has two  sisters, one of her sisters has migraine headaches.  She had a great-  grandfather who died from a cerebral hemorrhage.  She had an aunt with  an abdominal aortic aneurysm.   IMPRESSION/PLAN:  This patient was referred to Dr. Corliss Skains through the  courtesy of Dr. Avie Echevaria to discuss her cerebral angiogram noted on  MRA on November 29, 2006.  Dr. Corliss Skains reviewed the images from the  MRI and MRA with the patient, her husband and her father.  He pointed  out the area that was concerning for an aneurysm.  The patient and her  family had little knowledge of aneurysms.  Aneurysms were explained in  detail along with the potential risks of having an aneurysm.  Treatment  options were also discussed in detail along with the risks and benefit  of each option.  These included conservative management with continued  monitoring of the aneurysm versus open craniotomy and clipping, versus  endovascular coiling and/or stenting.  The patient was also given some  written  materials to read at home.   Dr. Corliss Skains offered to refer the patient to a neurosurgeon for further  discussions. However, the patient declined.  She and her family would  like to proceed with endovascular treatment of the aneurysm.  This has  tentatively been scheduled for November 02, 2007.  She will have a  cerebral angiogram first to provide more detail regarding the aneurysm.  Afterwards, she will have stenting and/or coiling of the aneurysm  performed under general anesthesia by Dr. Corliss Skains if felt to be safe  and indicated.  Greater than 40 minutes were spent on this consult.   DISCHARGE MEDICATIONS:  The patient was given a prescription for Plavix  to be started 3 days prior to the procedure along with aspirin also to  be started 3 days prior to the procedure.      Delton See, P.A.    ______________________________  Grandville Silos. Corliss Skains, M.D.    DR/MEDQ  D:  10/25/2007  T:  10/25/2007  Job:  [id number]   cc:   Evie Lacks, MD  Kingsley Callander Ouida Sills, MD

## 2011-03-02 NOTE — H&P (Signed)
[name], [name]              ACCOUNT NO.:  [id number]   MEDICAL RECORD NO.:  [id number]          PATIENT TYPE:  OIB   LOCATION:  3110                         FACILITY:  [facility]   PHYSICIAN:  Sanjeev K. Deveshwar, M.D.DATE OF BIRTH:  1966/01/31   DATE OF ADMISSION:  12/14/2007  DATE OF DISCHARGE:                              HISTORY & PHYSICAL   CHIEF COMPLAINT:  Cerebral aneurysm.   HISTORY OF PRESENT ILLNESS:  This is a very pleasant 45 year old female  with long history of headaches followed by her primary care physician,  Dr. Carylon Perches in  [place], and referred to Dr. Avie Echevaria for  evaluation of headaches.   An MRA was performed November 29, 2006 that showed a 6-mm saccular  aneurysm of the left carotid siphon.  An angiogram was recommended.  Dr.  Corliss Skains saw the patient in consultation on October 23, 2006.  A  cerebral angiogram was performed on November 02, 2007 which did confirm a  5.3 x 5.5-mm left internal carotid artery caval-cavernous aneurysm  projecting medially and inferiorly, which was felt to be extradural at  the time of the angiogram.  Because of its location, Dr. Corliss Skains felt  that the aneurysm was stable and would not need to be treated.   The patient later contacted [place] saying that she was very concerned about  the aneurysm.  She did not feel comfortable living with the aneurysm and  knowing that it existed.  The patient was seen in consultation a second  time by Dr. Corliss Skains, and at that time Dr. Corliss Skains agreed to proceed  with endovascular treatment of the aneurysm.  The patient was admitted  to [facility] on December 14, 2007 for that treatment.   PAST MEDICAL HISTORY:  Significant for migraine headaches,  gastroesophageal reflux disease, occasional sinus drainage, a history of  an essential tremor per Dr. Sandria Manly.  She had an echo cardiogram several  years ago.  We do not have the results of the study.  The patient  reports some memory  problems.  She has history of endometriosis.   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  She still has somewhat disconjugate eye movements.  She had  an appendectomy in 2003.  She had a tubal ligation 2003.  She reports  nausea associated with anesthesia.   ALLERGIES:  CODEINE CAUSES NAUSEA AND VOMITING.  THE PATIENT DOES NOT  FEEL THAT SHE TOLERATES PAIN MEDICATIONS VERY WELL.  SHE HAS DEVELOPED A  RASH WITH ADHESIVES.   MEDICATIONS:  At the time of admission included Topamax, Elavil,  aspirin, and Plavix which was started in anticipation of the  endovascular treatment of the aneurysm.   SOCIAL HISTORY:  The patient is married.  She her husband live in  [place], [place].  They have a 42 year old daughter.  The patient  has never smoked.  She uses alcohol rarely.  She works in a Therapist, occupational.   FAMILY HISTORY:  The patient's father is alive and well.  He is age 21.  Her mother  is age 52.  She has heart disease and a history of migraine  headaches.  She has two sisters.  One sister has migraine headaches.  She had a great grandfather who died from a cerebral hemorrhage.  She  had an aunt with an abdominal aortic aneurysm.   LABORATORY DATA:  Her INR was 1.0.  The PTT was 27.  CBC revealed  hemoglobin 14.4, hematocrit 42.1, WBC 6.2,000, platelets 287,000, BUN  10, creatinine 0.76, potassium 3.9, glucose 88, GFR was greater than 60.   REVIEW OF SYSTEMS:  The patient has noted some recent odd sensations in  her heart.  She feels these may be palpitations.  She denies any chest  pain or shortness of breath.  She reports that she bruises easily.  She  had some vomiting after her last cerebral angiogram.   PHYSICAL EXAM:  GENERAL:  Reveals a very pleasant 45 year old white  female in no acute distress.  VITAL SIGNS:  Blood pressure 116/75, pulse 68, respirations 18,  temperature 97.4, oxygen saturation 100% on room air.   HEENT:  Unremarkable, except for slightly disconjugate eye movements.  NECK:  Reveals no bruits.  HEART:  Reveals regular rate and rhythm without murmur.  LUNGS:  Clear.  ABDOMEN:  Nontender.  EXTREMITIES:  Reveal pulses to be intact without edema.  Her airway is a  1.  Her ASA scale is a 2.  NEUROLOGIC:  Mental status:  The patient is alert and oriented and  follows commands.  Cranial nerves II-XII are grossly intact.  Sensation  is intact to light touch.  Motor strength is 5/5.  Cerebellar testing is  intact.   IMPRESSION:  1. A 5.3 x 5.5-mm left internal carotid artery caval-cavernous      aneurysm felt to be extradural, as documented by cerebral angiogram      performed November 02, 2007.  2. History of migraine headaches.  3. Gastroesophageal reflux disease.  4. Occasional sinus drainage.  5. History of essential tremor.  6. Previous 2-D echo, results not available/  7. Recent memory problems.  8. Status post multiple surgeries including surgery for disconjugate      eye movements.  9. History of endometriosis.  10.Intolerance to codeine and sensitivity to pain medications.  11.Allergic to adhesives.   PLAN:  As noted, the patient has been started on Plavix in anticipation  of the procedure today.  The cerebral angiogram will not be repeated.  Dr. Corliss Skains will embolize the aneurysm endovascularly, using stents  and/or coils.      Delton See, P.A.    ______________________________  Grandville Silos. Corliss Skains, M.D.    DR/MEDQ  D:  12/14/2007  T:  12/15/2007  Job:  [id number]   cc:   Kingsley Callander. Ouida Sills, MD  Evie Lacks, MD  Patient Chart

## 2011-03-02 NOTE — Discharge Summary (Signed)
NAMEHIND, [name]              ACCOUNT NO.:  [id number]   MEDICAL RECORD NO.:  [id number]           PATIENT TYPE:   LOCATION:                                 FACILITY:   PHYSICIAN:  Sanjeev K. Deveshwar, M.D.DATE OF BIRTH:  Aug 04, 1966   DATE OF ADMISSION:  DATE OF DISCHARGE:                               DISCHARGE SUMMARY   PRIMARY CARE PHYSICIAN:  Carylon Perches, M.D.   CHIEF COMPLAINT:  Cerebral aneurysm.   HISTORY OF PRESENT ILLNESS:  This is a very pleasant 45 year old female  with a long history of headaches followed by her primary care physician,  Dr. Carylon Perches in [place], and referred to Dr. Avie Echevaria for  evaluation of headaches. An MRI was performed November 29, 2006 that  showed a 6 mm saccular aneurysm of the left carotid siphon. An angiogram  was recommended by Dr. Corliss Skains who saw the patient in consultation on  October 23, 2006. A cerebral angiogram was performed October 24, 2007. A  cerebral angiogram was performed on November 02, 2007 which did confirm a  5.3 x 5.5-mm left internal carotid artery cavernous aneurysm projecting  medially and inferiorly, which was felt to be extradural at the time of  the angiogram. Because of its location, Dr. Corliss Skains felt that the  aneurysm was stable and did not require treatment.   The patient later contacted [place] stating that she was very concerned about  the aneurysm. She did not feel comfortable living with the aneurysm. She  requested further consultation with Dr. Eustace Quail. She met with him  again and endovascular treatment was requested. Dr. Eustace Quail agreed to  proceed. The patient was admitted to [facility] on December 14, 2007 for that treatment.   PAST MEDICAL HISTORY:  Significant for migraine headaches,  gastroesophageal reflux disease, occasional sinus drainage, a history of  an essential tremor per Dr. Sandria Manly. The patient had an echocardiogram  several years ago. We do not have the results of that  study. The patient  reports some memory problems. She has history of endometriosis as well.   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 She still has somewhat disconjugate eye  movements. She denies diplopia. She had an appendectomy in 2003. She had  a tubal ligation in 2003. She reports nausea associated with anesthesia  in the past.   ALLERGIES:  CODEINE CAUSES NAUSEA. THE PATIENT DOES NOT FEEL THAT SHE  TOLERATES PAIN MEDICATIONS VERY WELL. SHE HAS DEVELOPED A RASH WITH  ADHESIVES.   MEDICATIONS AT TIME OF ADMISSION:  Medications at time of admission  included Topamax, Elavil, aspirin, and Plavix which was started in  anticipation of the endovascular treatment of her aneurysm.   SOCIAL HISTORY:  The patient is married. She and her husband in live in  [place], [place]. They have a 70 year old daughter. The patient  has never smoked. She uses alcohol rarely. She works in a Therapist, occupational.   FAMILY HISTORY:  The patient's father is alive and well at age 55. Her  mother is age 71, she has heart disease and a history of migraine  headaches. She has two sisters, one sister has migraine headaches. She  has a grandfather who died from a cerebral hemorrhage. She had an aunt  with an abdominal aortic aneurysm.   HOSPITAL COURSE:  As noted, this patient was admitted to [facility] on December 14, 2007 for treatment of a previously diagnosed  left internal carotid artery cavernous aneurysm estimated to be 5.3 x  5.5 mm by angiogram performed November 02, 2007. The patient was placed  under general anesthesia. Dr.  Corliss Skains, M.D. performed a stent  assisted coiling of the aneurysm without any immediate or known  complications. Please see his complete dictated note for full details.   Following the intervention, the patient was taken to the Neuro Post  Anesthesia Care Unit and then admitted to the Neuro Intensive  Care Unit  where she remained on IV Heparin overnight. She did have some bleeding  from her groin at approximately 4 or 5 a.m. that morning. This required  additional pressure. Her IV Heparin was discontinued. The right femoral  groin sheath was removed a approximately 9 a.m. on the 27th. Hemostasis  was obtained. The patient was placed on bedrest for 6 hours which is our  standard protocol. After 6 hours, the patient was ambulated and she did  feel a little bit unsteady on her feet. The plan at this time was to  allow her to have her evening meal and then to proceed with discharge if  she was feeling up to it.   She was noted to be hypokalemic on the day of discharge. Her potassium  was 3.0. She was given a total of 80 mEq of potassium in divided doses  that day. It was recommended that she followup in 1 week for a repeat  potassium level. She was also mildly anemic on the day of discharge.   LABORATORY DATA:  A chemistry profile on the day of discharge revealed  BUN 6, creatinine 0.6. GFR was greater than 60. Potassium was 3.  Chloride was 116. Glucose was 121. A CBC on the day of discharge  revealed hemoglobin 11.3, hematocrit 33.5, WBC 10.6, platelets 232,000.  On the 23rd, her potassium had been 3.9.   DISCHARGE INSTRUCTIONS:  The patient was told to resume her home  medications as taken previously. Please see the list as noted above. She  was told to stay on Plavix 75 mg daily until instructed otherwise by Dr.  Corliss Skains. She was to continue aspirin 81 mg daily.   The patient was told to resume her previous diet. She was given wound  care instructions. She was told not to drive or do anything strenuous  for at least 2 weeks. She was not to return to work for 2 weeks.   A followup angiogram was recommended in 3 to 4 months to further  evaluate the status of the aneurysm. The patient was told to contact her  primary care physician, Dr. Ouida Sills, within 1 week to have a repeat CBC   blood test as well as a potassium level. The patient will followup with  Dr. Eustace Quail March 12th at 3 p.m. She was told to call us in the  meantime if she had any problems with her groin or any new neurological  symptoms.   PROBLEM LIST AT TIME OF DISCHARGE:  1. 5.3 x 5.5 mm left internal carotid artery aneurysm.  2. Status post stent  assisted coiling of the aneurysm performed      endovascularly by Dr. Eustace Quail on December 14, 2007 under general      anesthesia.  3. History of migraine headaches.  4. Mildly low potassium supplemented with followup recommended.  5. Mild anemia with followup recommended.  6. History of gastroesophageal reflux disease.  7. Occasional sinus drainage.  8. History of an essential tremor.  9. Previous 2D echocardiogram, results not available.  10.Recent memory problems.  11.Status post multiple surgeries.  12.History of endometriosis.  13.Intolerance to Codeine and sensitivity to pain medications.  14.Allergic to adhesives.      Delton See, P.A.    ______________________________  Grandville Silos. Corliss Skains, M.D.    DR/MEDQ  D:  12/15/2007  T:  12/16/2007  Job:  [id number]   cc:   Kingsley Callander. Ouida Sills, MD  Genene Churn. Love, M.D.  Sanjeev K. Corliss Skains, M.D.

## 2011-03-02 NOTE — Consult Note (Signed)
[name], [name]              ACCOUNT NO.:  [id number]   MEDICAL RECORD NO.:  [id number]          PATIENT TYPE:  OUT   LOCATION:  XRAY                         FACILITY:  [facility]   PHYSICIAN:  Sanjeev K. Deveshwar, M.D.DATE OF BIRTH:  1966/05/31   DATE OF CONSULTATION:  11/17/2007  DATE OF DISCHARGE:                                 CONSULTATION   CHIEF COMPLAINT:  Cerebral aneurysm.   HISTORY OF PRESENT ILLNESS:  This is a very pleasant 45 year old female  with a long history of headaches, followed by her primary care  physician, Dr. Carylon Perches, and referred to Dr. Avie Echevaria for evaluation.  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  An angiogram was recommended.  The  patient was referred to Dr. Corliss Skains, who saw the patient initially in  consultation on October 23, 2006.  The patient was scheduled for a  cerebral angiogram with possible intervention on November 02, 2007.  She  did have the angiogram performed by Dr. 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  The remainder of  the examination was normal.  The findings were discussed with the  patient and her family.  Because of the location of the aneurysm, it was  not felt that intervention was indicated at that time; however, it was  felt the aneurysm should be followed with an MRI and MRA in 6 months. If  the aneurysm changes or enlarges, treatment should be considered.  Also  if the patient became symptomatic with worsening headaches or TIA-like  symptoms, the aneurysm should also be treated.  Arrangements were made  for the patient to return to be seen in followup by Dr. Corliss Skains today,  November 17, 2007, for further discussions.   PAST MEDICAL HISTORY:  Significant for:  1.  Migraine headaches.  2. Gastroesophageal reflux disease.  3. Occasional sinus drainage.  4. An essential tremor per Dr. Sandria Manly.  5. She had an echocardiogram several years ago; we do not have the      results of this study.  6. The patient reports some memory problems.   Please see the other history as listed above.   SURGICAL HISTORY:  Significant for:  1. Appendectomy.  2. Previous eyes surgery.  3. Tubal ligation.  4. Surgery for endometriosis.   INTOLERANCES:  She reports nausea with ANESTHESIA.   ALLERGIES:  CODEINE causes nausea and vomiting.  SHE DOES NOT FEEL THAT  SHE TOLERATES PAIN MEDICATIONS VERY WELL.  SHE DEVELOPS A RASH WITH  ADHESIVES.   MEDICATIONS:  Topamax, Elavil and low-dose aspirin.  She was she had  been on birth control pills in the past.   SOCIAL HISTORY:  The patient is married.  She and her husband live in  [place], [place].  They have a 24 year old daughter.  The patient  has never smoked.  She uses alcohol rarely.  She works in Technical brewer at  [employer].   FAMILY HISTORY:  This patient's father is alive and well; he is age 85.  Her mother is age 57; she has heart disease and history of migraine  headaches.  She has 2 sisters; 1 sister has migraine headaches.  She had  a great-grandfather who died from a cerebral hemorrhage.  She had an  aunt with an abdominal aortic aneurysm.   IMPRESSION AND PLAN:  As noted, the patient and her husband returned  today for a followup appointment with Dr. Corliss Skains.  The patient  reports that she has been doing well, although she has continued to have  some headaches.  She also reports that she has had some tenderness in  her groin area at the site where the angiogram was performed.  She  denies any ecchymosis or swelling.  We discussed taking over-the-counter  Aleve for several days to see if her symptoms will improve.  If she  continues to have pain, we may need to consider an ultrasound for  further  evaluation.   Once again, Dr. Corliss Skains reiterated the plan; it was discussed after  the angiogram.  Unfortunately, at that time, the patient was still  recovering from conscious sedation and she does not remember many of the  details regarding the conversation.  Dr. Corliss Skains felt that the  aneurysm was outside the dural ring, making at low risk for a  subarachnoid hemorrhage.  There is still risk for rupture of the  aneurysm with a subsequent cerebrovascular accident.  He did recommend  low-dose aspirin as well as an MRI/MRA in 6 months to see if the  aneurysm is changing.   If in the interim the patient develops new neurologic-type symptoms, she  is to contact us or if the patient feels uncomfortable with the fact  that the aneurysm still exists and she wants to proceed with coiling,  Dr. Corliss Skains would be willing to schedule her for this procedure.  At  this time, the plan is for further monitoring.  Dr. Corliss Skains did review  the results of the angiogram images with the patient and her husband.  All of their questions were answered.  Greater than 30 minutes were  spent on this consult.      Delton See, P.A.    ______________________________  Grandville Silos. Corliss Skains, M.D.    DR/MEDQ  D:  11/17/2007  T:  11/18/2007  Job:  [id number]   cc:   Genene Churn. Love, M.D.  Kingsley Callander. Ouida Sills, MD

## 2011-03-02 NOTE — Consult Note (Signed)
[name], [name]              ACCOUNT NO.:  [id number]   MEDICAL RECORD NO.:  [id number]          PATIENT TYPE:  OUT   LOCATION:  XRAY                         FACILITY:  [facility]   PHYSICIAN:  Sanjeev K. Deveshwar, M.D.DATE OF BIRTH:  Dec 20, 1965   DATE OF CONSULTATION:  12/28/2007  DATE OF DISCHARGE:                                 CONSULTATION   ADDENDUM   The patient had been hypokalemic in the hospital.  This was  supplemented.  She was told to have a followup potassium level.  Apparently,  she did follow up with Dr. Ouida Sills and she reports her  potassium was fine.   The patient also reports that she had some bruising of her groin area  following the angiogram and intervention.  She is still having some  discomfort in this area.  The patient was examined in the nurse's  station with a nurse present.  She has some mild bruising of the groin  area which does appear to be resolving.  There was no bruit.  There was  no sign of a hematoma.  Dr. Corliss Skains reassured the patient that this  will continue to improve.  He advised her to avoid going up and down  stairs until the pain has resolved.      Delton See, P.A.    ______________________________  Grandville Silos. Corliss Skains, M.D.    DR/MEDQ  D:  12/28/2007  T:  12/29/2007  Job:  [id number]   cc:   Kingsley Callander. Ouida Sills, MD  Evie Lacks, MD

## 2011-03-05 NOTE — Procedures (Signed)
NAMELOREAL, SCHUESSLER              ACCOUNT NO.:  [id number]   MEDICAL RECORD NO.:  [id number]          PATIENT TYPE:  OUT   LOCATION:  RAD                           FACILITY:  [facility]   PHYSICIAN:  Vida Roller, M.D.   DATE OF BIRTH:  Oct 01, 1966   DATE OF PROCEDURE:  09/25/2004  DATE OF DISCHARGE:                                  ECHOCARDIOGRAM   PRIMARY CARE PHYSICIAN:  Dr. Ouida Sills.   TAPE NUMBER:  [id number].   TAPE COUNT:  [id number] through 2804.   HISTORY:  This is a 45 year old woman with palpitations, no previous  echocardiograms.  Technical quality is adequate.   M-MODE TRACINGS:  Aorta is 28 mm.   Left atrium is 32 mm.   The septum is 10 mm.   The posterior wall is 9 mm.   The left ventricular diastolic dimension is 42 mm.   Left ventricular systolic dimension 26 mm.   2-D AND DOPPLER IMAGING:  The left ventricle is normal size with normal  systolic function.  There are no wall motion abnormalities seen.  There is  no left ventricular hypertrophy seen.  Ejection fraction estimated at 60 to  65%.   Right ventricle size with normal systolic function.   Both atria appear to be normal size.   The aortic valve is mildly sclerotic.  There is mild insufficiency, no  stenosis is seen.   The mitral valve has slightly thickened leaflets, but no stenosis or  regurgitation is seen.   The tricuspid valve has trivial insufficiency, no stenosis is seen.   The pulmonic valve is not well seen.   There is no pericardial effusion.   The ascending aorta appears to be normal size.   Inferior vena cava is normal size.   RECOMMENDATIONS:  Antibiotic prophylaxis is recommended.     Trey Paula   JH/MEDQ  D:  09/25/2004  T:  09/26/2004  Job:  [id number]

## 2011-03-05 NOTE — Op Note (Signed)
[facility]  Patient:    [name], [name] Visit Number: [id number] MRN: [id number]          Service Type: OBV Location: 4A A420 01 Attending Physician:  Tilda Burrow Dictated by:   Christin Bach, M.D. Proc. Date: 02/01/02 Admit Date:  02/01/2002 Discharge Date: 02/02/2002   CC:         Roetta Sessions, M.D., [facility]   Operative Report  PREOPERATIVE DIAGNOSES:  Desire for elective permanent sterilization; right lower quadrant pain, ? etiology.  POSTOPERATIVE DIAGNOSES:  Desire for elective permanent sterilization; right lower quadrant pain, ? etiology; right broad ligament endometriosis.  PROCEDURE:  Diagnostic laparoscopy, laparoscopic tubal sterilization by Falope ring applicator, laparoscopic appendectomy, excision of broad ligament endometriosis.  SURGEON:  Christin Bach, M.D.  ASSISTANT:  ______  ANESTHESIA:  General.  COMPLICATIONS:  None.  FINDINGS:  Right broad ligament endometriosis, normal-appearing tubes and ovaries bilaterally, relatively short infundibulopelvic ligaments with greater tautness than usually found but nothing distinctly pathologic.  No evidence of small bowel adhesions.  INDICATIONS:  A 45 year old female evaluated thoroughly by Dr. Roetta Sessions for right lower quadrant pain, particularly associated with movement, point tenderness which has been evaluated extensively by GI workup with no specific etiology to pain identified.  The pain is primarily associated with movement, occurs at all times of the day and all times of the menstrual cycles.  DETAILS OF PROCEDURE:  Patient was taken to the operating room and prepped and draped for a combined abdominal and vaginal procedure, with a Cohen cannula and single-tooth tenaculum attached to the uterus after the Hulka tenaculum could not be threaded through the endocervical canal.  The infraumbilical vertical 1-cm skin incision was made, as well  as a transverse suprapubic 1-cm incision. The patients legs had been supported in Yelowfin leg supports to allow access to the vaginal manipulator.  The infraumbilical vertical incision was made for Veress needle insufflation x3 L CO2, followed by introduction of the laparoscopic trocar under direct visualization.  The suprapubic trocar was inserted under direct visualization, and then attention directed to the pelvis.  Photo-documentation of the pelvic structures was performed.  The tip of the Cohen cannula could be seen deforming the anterior uterine fundus but did not perforate.  The adnexal structures were grossly within normal limits.  Close inspection showed endometriosis scarring between the right round ligament and the right infundibulopelvic ligament.  We proceeded to place a third trocar in the right lower quadrant.  Inspection of the right adnexa showed a normal small appendix with easy access to it.  Tubal ligation:  Tubal ligation was performed by grasping a mid-segment portion of each tube, elevating it into the Falope ring applier and firing the ring across the knuckle of tube.  The incarcerated knuckle of tube and the underlying broad ligament were infiltrated with dilute Marcaine solution.  We then proceeded with appendectomy.  The appendectomy consisted of identifying the appendix, elevating it with a laparoscopic green clamp, separating the mesoappendix from the appendiceal stump and firing an Endo GIA across the appendiceal stump as well as across the mesoappendix.  There was some oozing from the area of vascularity just between the two clamp firings.  This required two clips to be placed, which resulted in good hemostasis. Irrigation of the right lower quadrant of the approximately 50 cc of blood that were lost showed good hemostasis.  We then reinspected the pelvis, identified once again the endometriosis implant, identified the ureter as being well out  of harms  way and then sharply dissected around the endometrial implant, removing it and sending it for histologic evaluation.  We then inspected the adnexa and some consideration was given as to whether to remove the right tube and ovary.  The concern that there might be some abnormal tightness to the right infundibulopelvic ligament was dismissed after manipulation showed a relatively normal structure.  Again, the ovaries showed no adhesions.  There was no identifiable endometriosis in the cul-de-sac.  We then irrigated the abdomen, left 200 cc of saline in the abdomen, deflated the abdomen and removed laparoscopic instruments and used 0 Vicryl to close the fascial puncture sites and then subcuticular 4-0 Dexon to close beneath the skin.  The patient tolerated the procedure well and went to the recovery room in good condition. Dictated by:   Christin Bach, M.D. Attending Physician:  Tilda Burrow DD:  02/02/02 TD:  02/03/02 Job: [id number]

## 2011-03-05 NOTE — Consult Note (Signed)
[name]              ACCOUNT NO.:  [id number]   MEDICAL RECORD NO.:  [id number]          PATIENT TYPE:  OUT   LOCATION:  XRAY                         FACILITY:  [facility]   PHYSICIAN:  [name], M.D.DATE OF BIRTH:  19-Sep-1966   DATE OF CONSULTATION:  10/23/2006  DATE OF DISCHARGE:                                 CONSULTATION   CHIEF COMPLAINT:  Cerebral aneurysm.   HISTORY OF PRESENT ILLNESS:  This is a very pleasant 45 year old female  with a history of headaches for greater than 18 years.  Approximately 6  months ago her headaches became worse.  She was referred to [name] from her primary care physician, [name].  An MRA was  performed on November 29, 2006, that showed a possible 6 mm saccular  aneurysm of the left carotid siphon. An angiogram has been recommended.  Dr. Sandria Armstrong has referred the patient to [name] for further  evaluation.  The patient presents today accompanied by her husband and  father or further discussions and evaluation.   PAST MEDICAL HISTORY:  As noted, the patient has a long history of  migraine headaches.  Her headaches changed approximately 6 months ago.  She notes an occasional rapid heart rate; however, she feels this is due  to decongestant-type medications.  She does have gastroesophageal reflux  disease, occasional sinus drainage. She reports that she bruises easily.  She has occasional constipation.  She has an essential tremor per Dr.  Sandria Armstrong. The patient believes she had some sort of an echocardiogram  several years ago.  However, we do not have details regarding that  information. As noted, she had an MRA on November 29, 2006, that showed  a possible 6-mm saccular aneurysm of the left carotid siphon.  She had  an MRI that showed rare punctate subcortical white matter lesions which  were felt to be nonspecific. On October 06, 2007, she had a CT  angiogram that showed a 5-6 mm aneurysm of the cavernous left  internal  carotid artery.  There was a question of a 3-4 mm right middle cerebral  artery aneurysm.  The patient reports that she has had some memory  problems and visual changes associated with her headaches.  She has also  had intermittent loss of vision of one eye.   PAST SURGICAL HISTORY:  Significant for:  1. Appendectomy in 2003.  2. She had previous eyes surgery.  3. She has a history of tubal ligation in 2003.  4. She did have endometriosis.   She reports nausea with anesthesia.   ALLERGIES:  CODEINE causes nausea and vomiting.  She feels that she does  not tolerate PAIN MEDICATIONS very well.  She develops a rash with  ADHESIVES   CURRENT MEDICATIONS:  Include Topamax and Elavil.  She has been on birth  control pills in the past.   SOCIAL HISTORY:  The patient is married.  She and her husband live in  [place], [place].  They have a 47 year old daughter.  She has  never smoked.  She uses alcohol rarely.  She works  as a   [employer]      [name], P.A.    ______________________________  [name]. [name], M.D.    DR/MEDQ  D:  10/24/2007  T:  10/24/2007  Job:  [id number]   cc:   Bethany Lacks, MD  Bethany Callander Ouida Sills, MD

## 2011-03-05 NOTE — Op Note (Signed)
[name], [name]              ACCOUNT NO.:  [id number]   MEDICAL RECORD NO.:  [id number]          PATIENT TYPE:  AMB   LOCATION:  DSC                          FACILITY:  [facility]   PHYSICIAN:  Pasty Spillers. Maple Hudson, M.D. DATE OF BIRTH:  07-19-1966   DATE OF PROCEDURE:  02/26/2005  DATE OF DISCHARGE:                                 OPERATIVE REPORT   PREOPERATIVE DIAGNOSIS:  Partially accommodative esotropia.   POSTOPERATIVE DIAGNOSIS:  Partially accommodative esotropia.   PROCEDURE:  Medial rectus muscle recession, 6.5 mm, each eye.   SURGEON:  Verne Carrow, M.D.   ANESTHESIA:  General (laryngeal mask).   COMPLICATIONS:  None.   DESCRIPTION OF PROCEDURE:  After routine preoperative evaluation including  informed consent, the patient was taken to the operating room. She was  identified by me. General anesthesia was induced without difficulty after  placement of appropriate monitors. The patient was prepped and draped in the  standard sterile fashion. Lid speculum placed in left eye.   Through an inferonasal fornix incision through conjunctiva and Tenon's  fascia, the left medial rectus muscle was engaged on a series of muscle  hooks and cleared of its fascial attachments. The tendon was secured with a  double-arm 6-0 Vicryl suture, with a double-locked bite at each border of  the muscle. The muscle was disinserted, and was reattached to the sclera at  a measured distance of 6.5 mm posterior to the original insertion, using  direct scleral passes in crossed swords fashion. The sutures ends were tied  securely after the position of muscle had been checked and found to be  accurate. Conjunctiva was closed with two 6-0 plain gut sutures. The  speculum was transferred to the right eye, where an identical procedure was  performed, again effecting a 6.5 mm recession of the medial rectus muscle.  TobraDex ointment was placed in each eye. The patient was awakened without  difficulty and  taken to the recovery room in stable condition, having  suffered no intraoperative or immediate postoperative complications.      WOY/MEDQ  D:  02/26/2005  T:  02/26/2005  Job:  [id number]

## 2011-04-22 ENCOUNTER — Telehealth: Payer: Self-pay

## 2011-04-22 NOTE — Telephone Encounter (Signed)
Please call pt. If she is having uncontrolled reflux Sx, she should follow a low fat diet and increase her OMP to BID 30 minutes prior to meals, #[phone], [id number].

## 2011-04-22 NOTE — Telephone Encounter (Signed)
Pt called and said the Prilosec once daily is not working. Please advise. Call back is [phone].

## 2011-04-22 NOTE — Telephone Encounter (Signed)
Pt informed and Rx called to [facility] and [facility].

## 2011-06-02 ENCOUNTER — Ambulatory Visit: Payer: Managed Care, Other (non HMO) | Admitting: Gastroenterology

## 2011-06-02 ENCOUNTER — Telehealth: Payer: Self-pay | Admitting: Gastroenterology

## 2011-06-02 NOTE — Telephone Encounter (Signed)
NOTED

## 2011-06-10 ENCOUNTER — Ambulatory Visit: Payer: Managed Care, Other (non HMO) | Admitting: Gastroenterology

## 2011-07-07 LAB — CBC
HCT: 40.8
Hemoglobin: 13.9
MCV: 88.7
Platelets: 268
RDW: 14

## 2011-07-07 LAB — PROTIME-INR: Prothrombin Time: 13.6

## 2011-07-07 LAB — DIFFERENTIAL
Basophils Absolute: 0
Eosinophils Absolute: 0.1
Eosinophils Relative: 2
Monocytes Absolute: 0.4

## 2011-07-07 LAB — BASIC METABOLIC PANEL
BUN: 8
CO2: 24
Chloride: 106
Glucose, Bld: 95
Potassium: 3.5

## 2011-07-09 LAB — CBC
HCT: 33.5 — ABNORMAL LOW
HCT: 35.3 — ABNORMAL LOW
HCT: 42.1
Hemoglobin: 12.3
Hemoglobin: 14.4
MCHC: 33.7
MCHC: 34.3
MCHC: 34.8
MCV: 89.8
MCV: 89.9
MCV: 91
Platelets: 232
Platelets: 287
RBC: 3.94
RBC: 4.68
RDW: 13.8
RDW: 14
WBC: 10.6 — ABNORMAL HIGH
WBC: 6.2

## 2011-07-09 LAB — BASIC METABOLIC PANEL
BUN: 10
BUN: 6
CO2: 19
CO2: 21
Calcium: 7.6 — ABNORMAL LOW
Calcium: 9.1
Chloride: 109
Chloride: 116 — ABNORMAL HIGH
Creatinine, Ser: 0.6
Creatinine, Ser: 0.76
GFR calc Af Amer: >60
GFR calc Af Amer: >60
GFR calc non Af Amer: >60
GFR calc non Af Amer: >60
Glucose, Bld: 121 — ABNORMAL HIGH
Glucose, Bld: 88
Potassium: 3 — ABNORMAL LOW
Potassium: 3.9
Sodium: 138
Sodium: 140

## 2011-07-09 LAB — DIFFERENTIAL
Basophils Absolute: 0
Basophils Relative: 0
Eosinophils Absolute: 0.1
Lymphocytes Relative: 20
Monocytes Absolute: 0.2
Neutro Abs: 4.7

## 2011-07-09 LAB — PROTIME-INR
INR: 1
Prothrombin Time: 13.5

## 2011-07-15 LAB — BASIC METABOLIC PANEL
BUN: 6
Calcium: 8.7
Chloride: 109
Creatinine, Ser: 0.67
GFR calc Af Amer: >60

## 2011-07-15 LAB — CBC
MCHC: 34
MCV: 90
Platelets: 265
WBC: 6.3

## 2011-07-15 LAB — PROTIME-INR
INR: 1.1
Prothrombin Time: 14.2

## 2011-08-13 ENCOUNTER — Other Ambulatory Visit: Payer: Self-pay | Admitting: Obstetrics and Gynecology

## 2011-08-13 DIAGNOSIS — Z1231 Encounter for screening mammogram for malignant neoplasm of breast: Secondary | ICD-10-CM

## 2011-08-30 ENCOUNTER — Ambulatory Visit
Admission: RE | Admit: 2011-08-30 | Discharge: 2011-08-30 | Disposition: A | Discharge status: Home / Self Care | Payer: Managed Care, Other (non HMO) | Source: Ambulatory Visit | Attending: Obstetrics and Gynecology | Admitting: Obstetrics and Gynecology

## 2011-08-30 DIAGNOSIS — Z1231 Encounter for screening mammogram for malignant neoplasm of breast: Secondary | ICD-10-CM

## 2011-09-08 ENCOUNTER — Other Ambulatory Visit: Payer: Self-pay | Admitting: Urgent Care

## 2011-09-08 ENCOUNTER — Telehealth: Payer: Self-pay

## 2011-09-08 ENCOUNTER — Ambulatory Visit (INDEPENDENT_AMBULATORY_CARE_PROVIDER_SITE_OTHER): Payer: Managed Care, Other (non HMO) | Admitting: Urgent Care

## 2011-09-08 ENCOUNTER — Encounter: Payer: Self-pay | Admitting: Urgent Care

## 2011-09-08 VITALS — BP 130/70 | HR 70 | Temp 98.4°F | Ht 66.0 in | Wt 165.2 lb

## 2011-09-08 DIAGNOSIS — R1013 Epigastric pain: Secondary | ICD-10-CM

## 2011-09-08 DIAGNOSIS — K5900 Constipation, unspecified: Secondary | ICD-10-CM

## 2011-09-08 DIAGNOSIS — R109 Unspecified abdominal pain: Secondary | ICD-10-CM

## 2011-09-08 DIAGNOSIS — R932 Abnormal findings on diagnostic imaging of liver and biliary tract: Secondary | ICD-10-CM

## 2011-09-08 DIAGNOSIS — D1803 Hemangioma of intra-abdominal structures: Secondary | ICD-10-CM

## 2011-09-08 DIAGNOSIS — K219 Gastro-esophageal reflux disease without esophagitis: Secondary | ICD-10-CM

## 2011-09-08 LAB — HEPATIC FUNCTION PANEL
AST: 18 U/L (ref 0–37)
Albumin: 4 g/dL (ref 3.5–5.2)
Alkaline Phosphatase: 94 U/L (ref 39–117)
Indirect Bilirubin: 0.3 mg/dL (ref 0.0–0.9)
Total Protein: 7.2 g/dL (ref 6.0–8.3)

## 2011-09-08 LAB — CBC WITH DIFFERENTIAL/PLATELET
Eosinophils Absolute: 0.1 10*3/uL (ref 0.0–0.7)
Eosinophils Relative: 2 % (ref 0–5)
HCT: 39.2 % (ref 36.0–46.0)
Hemoglobin: 12.9 g/dL (ref 12.0–15.0)
Lymphocytes Relative: 25 % (ref 12–46)
Lymphs Abs: 1.2 10*3/uL (ref 0.7–4.0)
MCH: 28.6 pg (ref 26.0–34.0)
MCV: 86.9 fL (ref 78.0–100.0)
Monocytes Relative: 8 % (ref 3–12)
RBC: 4.51 MIL/uL (ref 3.87–5.11)

## 2011-09-08 NOTE — Progress Notes (Signed)
Primary Care Physician:  Carylon Perches, MD Primary Gastroenterologist:  Dr. Darrick Penna  Chief Complaint  Patient presents with  . Abdominal Pain  . Diarrhea    HPI:  [name] is a 45 y.o. female w/ upper abdominal pain.  Taking prilosec BID.   C/o constant epigastric/upper abd pain that started 4 days ago.  Denies N/V.  Able to eat.  C/o anorexia.  Describes as pressure.  BM every day.  Takes metamucil 5 BID & miralax 17 grams daily.  Wt stable.  Denies dysphagia or odynophagia.  Tried gas x & pepto-no help.  Pain 8/10.  Not associated w/ ating.  Not affected by movement.  9/11 ABD US-> Abnormal appearing gallbladder demonstrating a mildly thickened wall with either septations or redundant mucosa, with similar findings described on the report of the prior exam from 2002. No definite shadowing gallstones or biliary dilatation identified. 3.8 x 2.7 x 4.5 cm diameter lobulated hyperechoic mass at posterior aspect right lobe liver, question hemangioma but demonstrating a significant increase in size versus a similar lesion described on report from 2002 exam, when lesion was reported to be 2.4 cm diameter.    Due to significant change in size, characterization of this hepatic lesion by MRI with and without contrast is recommended to exclude other etiologies. Incomplete pancreatic visualization as above.  MRI10/11->   1.  The right hepatic lobe lesion is a hemangioma. 2.  No biliary ductal dilatation or common duct stone. 3.  No evidence of acute pancreatitis.  Absence of normal T1 hyperintense signal on precontrast imaging may relate to the sequelae of prior pancreatitis.  This is a subtle/equivocal finding. 4.  Abnormal appearance of the gallbladder with numerous peripheral non enhancing septate within.  Similar findings were described back to 2002 on ultrasound.  Therefore, this is favored to be an incidental normal variant finding.  Less likely considerations include an atypical  appearance of chronic cholecystitis or adenomyomatosis  Past Medical History  Diagnosis Date  . Glaucoma   . Chronic constipation   . GERD (gastroesophageal reflux disease)   . Hemorrhoids, internal, with bleeding DEC 2010 TCS  . Migraines   . Aneurysm 2009 COILING  . Abdominal pain OCT 2011 NL CBC, HFP, LIPASE    MRCP-SEPTATED GB, NL CBD, R LOBE HEMANGIOMA  . Dysphagia     Past Surgical History  Procedure Date  . Glaucoma surgery MAR 2012  . Colonoscopy     DEC 2010 IH  . Tubal ligation   . Upper gastrointestinal endoscopy     OCT 2011 RING DILATED TO , CANDIDA ESOPHAGITIS, CHRONIC GASTRITIS  . Anuerysm repair     coil    Current Outpatient Prescriptions  Medication Sig Dispense Refill  . amitriptyline (ELAVIL) 25 MG tablet Take 100 mg by mouth at bedtime.        Marland Kitchen aspirin 81 MG tablet Take 81 mg by mouth every other day.        . frovatriptan (FROVA) 2.5 MG tablet Take 2.5 mg by mouth as needed. If recurs, may repeat after 2 hours. Max of 3 tabs in 24 hours.       Marland Kitchen ketoprofen (ORUDIS) 75 MG capsule Take 75 mg by mouth as needed.        Marland Kitchen omeprazole (PRILOSEC) 20 MG capsule 1 PO 30 MINUTES PRIOR TO YOUR FIRST MEAL  30 capsule  5  . promethazine (PHENERGAN) 25 MG tablet Take 25 mg by mouth as needed.  Allergies as of 09/08/2011  . (No Known Allergies)    Review of Systems: Gen: Denies any fever, chills, sweats, anorexia, fatigue, weakness, malaise, weight loss, and sleep disorder CV: Denies chest pain, angina, palpitations, syncope, orthopnea, PND, peripheral edema, and claudication. Resp: Denies dyspnea at rest, dyspnea with exercise, cough, sputum, wheezing, coughing up blood, and pleurisy. GI: Denies vomiting blood, jaundice, and fecal incontinence.   Denies dysphagia or odynophagia. Derm: Denies rash, itching, dry skin, hives, moles, warts, or unhealing ulcers.  Psych: Denies depression, anxiety, memory loss, suicidal ideation, hallucinations,  paranoia, and confusion. Heme: Denies bruising, bleeding, and enlarged lymph nodes.  Physical Exam: BP 130/70  Pulse 70  Temp 98.4 F (36.9 C)  Ht 5\' 6"  (1.676 m)  Wt 165 lb 3.2 oz (74.934 kg)  BMI 26.66 kg/m2  LMP 08/30/2011 General:   Alert,  Well-developed, well-nourished, pleasant and cooperative in NAD Head:  Normocephalic and atraumatic. Eyes:  Sclera clear, no icterus.   Conjunctiva pink. Mouth:  No deformity or lesions, oropharynx pink and moist. Neck:  Supple; no masses or thyromegaly. Heart:  Regular rate and rhythm; no murmurs, clicks, rubs,  or gallops. Abdomen:  Soft and nondistended. + small circular ecchymosis below umbilicus on right.  +Murphy's pt tenderness, RUQ tenderness.  No masses, hepatosplenomegaly or hernias noted. Normal bowel sounds, without guarding, and without rebound.   Msk:  Symmetrical without gross deformities. Normal posture. Pulses:  Normal pulses noted. Extremities:  Without clubbing or edema. Neurologic:  Alert and  oriented x4;  grossly normal neurologically. Skin:  Intact without significant lesions or rashes. Cervical Nodes:  No significant cervical adenopathy. Psych:  Alert and cooperative. Normal mood and affect.

## 2011-09-08 NOTE — Patient Instructions (Signed)
Go directly to the lab To emergency room if pain becomes severe Continued omeprazole 20 mg twice a day Avoid anti-inflammatories like ibuprofen or aspirin products or Aleve for pain

## 2011-09-08 NOTE — Assessment & Plan Note (Signed)
?  biliary pain vs. pain from enlarging hepatic hemangioma.

## 2011-09-08 NOTE — Assessment & Plan Note (Signed)
Improved

## 2011-09-08 NOTE — Assessment & Plan Note (Addendum)
On omeprazole 20mg  bid.  Hx gastritis, candida esophagitis.  See epigastric pain.

## 2011-09-08 NOTE — Telephone Encounter (Signed)
I called Minicia at [facility]. She said results have been faxed over. Darl Pikes walked up they just came over). Minicia is checking to see why the turn around time is so long for our STAT labs. Pt's CBC is normal , LFT's normal, Amylase normal at 37 and Lipase slightly elevated at 62. Urine pregnancy negative.

## 2011-09-08 NOTE — Telephone Encounter (Signed)
Per Lorenza Burton, NP, called and [facility] for pt that her labs were normal, go to ED if worsens over the week-end. Tommy Rainwater will call on Mon.

## 2011-09-08 NOTE — Assessment & Plan Note (Addendum)
Epigastric/Upper Abd pain.  Differentials include biliary pain, enlarging hepatic hemangioma, pancreatitis, recurrent candida & gastritis.  CBC, amylase,lipase, U preg, LFTs To emergency room if pain becomes severe Continued omeprazole 20 mg twice a day Avoid anti-inflammatories like ibuprofen or aspirin products or Aleve for pain

## 2011-09-10 ENCOUNTER — Ambulatory Visit (HOSPITAL_COMMUNITY)
Admission: RE | Admit: 2011-09-10 | Discharge: 2011-09-10 | Disposition: A | Discharge status: Home / Self Care | Payer: Managed Care, Other (non HMO) | Source: Ambulatory Visit | Attending: Urgent Care | Admitting: Urgent Care

## 2011-09-10 DIAGNOSIS — R1013 Epigastric pain: Secondary | ICD-10-CM | POA: Insufficient documentation

## 2011-09-10 DIAGNOSIS — K838 Other specified diseases of biliary tract: Secondary | ICD-10-CM | POA: Insufficient documentation

## 2011-09-10 DIAGNOSIS — D1803 Hemangioma of intra-abdominal structures: Secondary | ICD-10-CM | POA: Insufficient documentation

## 2011-09-13 ENCOUNTER — Telehealth: Payer: Self-pay | Admitting: Gastroenterology

## 2011-09-13 NOTE — Progress Notes (Signed)
Cc to PCP 

## 2011-09-13 NOTE — Telephone Encounter (Signed)
Patient is asking for [place] results & Lab Results and she states she is having black stools please advise today?

## 2011-09-13 NOTE — Progress Notes (Signed)
Quick Note:  LMOM for return call. Can let pt know abdominal ultrasound was stable. She does have some sludge. Her liver hemangioma is stable. Next step is EGD with Dr. Darrick Penna if she continues to have upper abd pain. CcCarylon Perches, MD   ______

## 2011-09-13 NOTE — Telephone Encounter (Signed)
pts phone number is [phone]

## 2011-09-13 NOTE — Telephone Encounter (Signed)
Please call pt to discuss results

## 2011-09-13 NOTE — Telephone Encounter (Signed)
Called pt- informed her that Tyler Aas had tried to call last week and that her labs were normal. Also informed her that KJ had tried to call this am. Pt stated she has changed her phone number. Informed her of KJ recommendations and if she was still having abd pain she needed an egd with slf. Pt stated she was still having abd pain and would like to schedule egd asap.   Crystal, please schedule egd with slf.

## 2011-09-13 NOTE — Telephone Encounter (Signed)
Darl Pikes- Please change number in computer. Thanks

## 2011-09-13 NOTE — Telephone Encounter (Signed)
Lab called last week with stat results. Please call lab to figure out why her labs did not transport into [facility] from Wednesday.

## 2011-09-14 ENCOUNTER — Other Ambulatory Visit: Payer: Self-pay | Admitting: Gastroenterology

## 2011-09-14 ENCOUNTER — Encounter (HOSPITAL_COMMUNITY): Payer: Self-pay | Admitting: Pharmacy Technician

## 2011-09-14 NOTE — Progress Notes (Signed)
Quick Note:  LMOM to call. ______ 

## 2011-09-14 NOTE — Progress Notes (Signed)
EGD ASAP FOR MELENA

## 2011-09-14 NOTE — Progress Notes (Signed)
Scheduled for 11/30@ 1:10

## 2011-09-14 NOTE — Progress Notes (Signed)
Results Cc to PCP  

## 2011-09-14 NOTE — Telephone Encounter (Signed)
Pt is scheduled for 11/30 @ 1:10- pt aware and instructions faxed to [phone] per her request

## 2011-09-14 NOTE — Progress Notes (Signed)
LAST EGD D75 V3 OCT 2011 CHRONIC GASTRITIS. PT C/O?MELENA AND CBC NL

## 2011-09-14 NOTE — Progress Notes (Signed)
Please schedule EGD with Dr. Darrick Penna Friday re: melena Thanks

## 2011-09-15 ENCOUNTER — Encounter (HOSPITAL_COMMUNITY): Payer: Self-pay | Admitting: Pharmacy Technician

## 2011-09-15 NOTE — Progress Notes (Signed)
Quick Note:  Pt informed. Said she is still having the abdominal pain and has been scheduled for EGD on Fri. ______

## 2011-09-15 NOTE — Telephone Encounter (Signed)
I contacted AP, our [facility] representative. He is checking into the problem and will be by the office to discuss.

## 2011-09-16 MED ORDER — SODIUM CHLORIDE 0.45 % IV SOLN
Freq: Once | INTRAVENOUS | Status: AC
  Administered 2011-09-17: 12:00:00 via INTRAVENOUS

## 2011-09-16 NOTE — Telephone Encounter (Signed)
[phone] was added to her mobile number

## 2011-09-17 ENCOUNTER — Encounter (HOSPITAL_COMMUNITY)
Admission: RE | Disposition: A | Discharge status: Home / Self Care | Payer: Self-pay | Source: Ambulatory Visit | Attending: Gastroenterology

## 2011-09-17 ENCOUNTER — Other Ambulatory Visit: Payer: Self-pay | Admitting: Gastroenterology

## 2011-09-17 ENCOUNTER — Ambulatory Visit (HOSPITAL_COMMUNITY)
Admission: RE | Admit: 2011-09-17 | Discharge: 2011-09-17 | Disposition: A | Discharge status: Home / Self Care | Payer: Managed Care, Other (non HMO) | Source: Ambulatory Visit | Attending: Gastroenterology | Admitting: Gastroenterology

## 2011-09-17 ENCOUNTER — Encounter (HOSPITAL_COMMUNITY): Payer: Self-pay | Admitting: *Deleted

## 2011-09-17 ENCOUNTER — Other Ambulatory Visit (HOSPITAL_COMMUNITY): Payer: Managed Care, Other (non HMO)

## 2011-09-17 DIAGNOSIS — K2940 Chronic atrophic gastritis without bleeding: Secondary | ICD-10-CM | POA: Insufficient documentation

## 2011-09-17 DIAGNOSIS — R109 Unspecified abdominal pain: Secondary | ICD-10-CM | POA: Insufficient documentation

## 2011-09-17 DIAGNOSIS — K2990 Gastroduodenitis, unspecified, without bleeding: Secondary | ICD-10-CM

## 2011-09-17 DIAGNOSIS — R1013 Epigastric pain: Secondary | ICD-10-CM

## 2011-09-17 DIAGNOSIS — K2970 Gastritis, unspecified, without bleeding: Secondary | ICD-10-CM

## 2011-09-17 HISTORY — DX: Cardiac arrhythmia, unspecified: I49.9

## 2011-09-17 HISTORY — PX: ESOPHAGOGASTRODUODENOSCOPY: SHX5428

## 2011-09-17 SURGERY — EGD (ESOPHAGOGASTRODUODENOSCOPY)
Anesthesia: Moderate Sedation

## 2011-09-17 MED ORDER — MEPERIDINE HCL 100 MG/ML IJ SOLN
INTRAMUSCULAR | Status: DC | PRN
  Administered 2011-09-17: 25 mg via INTRAVENOUS
  Administered 2011-09-17: 50 mg via INTRAVENOUS

## 2011-09-17 MED ORDER — MEPERIDINE HCL 100 MG/ML IJ SOLN
INTRAMUSCULAR | Status: AC
  Filled 2011-09-17: qty 2

## 2011-09-17 MED ORDER — PROMETHAZINE HCL 25 MG/ML IJ SOLN
INTRAMUSCULAR | Status: DC | PRN
  Administered 2011-09-17: 6.25 mg via INTRAVENOUS

## 2011-09-17 MED ORDER — MIDAZOLAM HCL 5 MG/5ML IJ SOLN
INTRAMUSCULAR | Status: AC
  Filled 2011-09-17: qty 10

## 2011-09-17 MED ORDER — OMEPRAZOLE 20 MG PO CPDR: DELAYED_RELEASE_CAPSULE | ORAL | Status: DC

## 2011-09-17 MED ORDER — MIDAZOLAM HCL 5 MG/5ML IJ SOLN
INTRAMUSCULAR | Status: DC | PRN
  Administered 2011-09-17: 1 mg via INTRAVENOUS
  Administered 2011-09-17: 2 mg via INTRAVENOUS

## 2011-09-17 MED ORDER — BUTAMBEN-TETRACAINE-BENZOCAINE 2-2-14 % EX AERO
INHALATION_SPRAY | CUTANEOUS | Status: DC | PRN
  Administered 2011-09-17: 2 via TOPICAL

## 2011-09-17 MED ORDER — STERILE WATER FOR IRRIGATION IR SOLN
Status: DC | PRN
  Administered 2011-09-17: 13:00:00

## 2011-09-17 MED ORDER — PROMETHAZINE HCL 25 MG/ML IJ SOLN
INTRAMUSCULAR | Status: AC
  Filled 2011-09-17: qty 1

## 2011-09-17 NOTE — H&P (Signed)
Reason for Visit     Abdominal Pain    Diarrhea        Vitals - Last Recorded       BP Pulse Temp Ht Wt BMI    130/70  70  98.4 F (36.9 C)  5\' 6"  (1.676 m)  165 lb 3.2 oz (74.934 kg)  26.66 kg/m2          LMP              08/30/2011                 Vitals History Recorded       Progress Notes     Lorenza Burton, NP  09/08/2011 10:14 PM  Signed Primary Care Physician:  Carylon Perches, MD Primary Gastroenterologist:  Dr. Darrick Penna    Chief Complaint   Patient presents with   .  Abdominal Pain   .  Diarrhea      HPI:  [name] is a 45 y.o. female w/ upper abdominal pain.  Taking prilosec BID.   C/o constant epigastric/upper abd pain that started 4 days ago.  Denies N/V.  Able to eat.  C/o anorexia.  Describes as pressure.  BM every day.  Takes metamucil 5 BID & miralax 17 grams daily.  Wt stable.  Denies dysphagia or odynophagia.  Tried gas x & pepto-no help.  Pain 8/10.  Not associated w/ ating.  Not affected by movement.   9/11 ABD US-> Abnormal appearing gallbladder demonstrating a mildly thickened wall with either septations or redundant mucosa, with similar findings described on the report of the prior exam from 2002. No definite shadowing gallstones or biliary dilatation identified. 3.8 x 2.7 x 4.5 cm diameter lobulated hyperechoic mass at posterior aspect right lobe liver, question hemangioma but demonstrating a significant increase in size versus a similar lesion described on report from 2002 exam, when lesion was reported to be 2.4 cm diameter.    Due to significant change in size, characterization of this hepatic lesion by MRI with and without contrast is recommended to exclude other etiologies. Incomplete pancreatic visualization as above.   MRI10/11->   1.  The right hepatic lobe lesion is a hemangioma. 2.  No biliary ductal dilatation or common duct stone. 3.  No evidence of acute pancreatitis.  Absence of normal T1 hyperintense signal on  precontrast imaging may relate to the sequelae of prior pancreatitis.  This is a subtle/equivocal finding. 4.  Abnormal appearance of the gallbladder with numerous peripheral non enhancing septate within.  Similar findings were described back to 2002 on ultrasound.  Therefore, this is favored to be an incidental normal variant finding.  Less likely considerations include an atypical appearance of chronic cholecystitis or adenomyomatosis    Past Medical History   Diagnosis  Date   .  Glaucoma     .  Chronic constipation     .  GERD (gastroesophageal reflux disease)     .  Hemorrhoids, internal, with bleeding  DEC 2010 TCS   .  Migraines     .  Aneurysm  2009 COILING   .  Abdominal pain  OCT 2011 NL CBC, HFP, LIPASE       MRCP-SEPTATED GB, NL CBD, R LOBE HEMANGIOMA   .  Dysphagia         Past Surgical History   Procedure  Date   .  Glaucoma surgery  MAR 2012   .  Colonoscopy  DEC 2010 IH   .  Tubal ligation     .  Upper gastrointestinal endoscopy         OCT 2011 RING DILATED TO , CANDIDA ESOPHAGITIS, CHRONIC GASTRITIS   .  Anuerysm repair         coil       Current Outpatient Prescriptions   Medication  Sig  Dispense  Refill   .  amitriptyline (ELAVIL) 25 MG tablet  Take 100 mg by mouth at bedtime.           Marland Kitchen  aspirin 81 MG tablet  Take 81 mg by mouth every other day.           .  frovatriptan (FROVA) 2.5 MG tablet  Take 2.5 mg by mouth as needed. If recurs, may repeat after 2 hours. Max of 3 tabs in 24 hours.          Marland Kitchen  ketoprofen (ORUDIS) 75 MG capsule  Take 75 mg by mouth as needed.           Marland Kitchen  omeprazole (PRILOSEC) 20 MG capsule  1 PO 30 MINUTES PRIOR TO YOUR FIRST MEAL   30 capsule   5   .  promethazine (PHENERGAN) 25 MG tablet  Take 25 mg by mouth as needed.               Allergies as of 09/08/2011   .  (No Known Allergies)      Review of Systems: Gen: Denies any fever, chills, sweats, anorexia, fatigue, weakness, malaise, weight loss, and sleep  disorder CV: Denies chest pain, angina, palpitations, syncope, orthopnea, PND, peripheral edema, and claudication. Resp: Denies dyspnea at rest, dyspnea with exercise, cough, sputum, wheezing, coughing up blood, and pleurisy. GI: Denies vomiting blood, jaundice, and fecal incontinence.   Denies dysphagia or odynophagia. Derm: Denies rash, itching, dry skin, hives, moles, warts, or unhealing ulcers.   Psych: Denies depression, anxiety, memory loss, suicidal ideation, hallucinations, paranoia, and confusion. Heme: Denies bruising, bleeding, and enlarged lymph nodes.   Physical Exam: BP 130/70  Pulse 70  Temp 98.4 F (36.9 C)  Ht 5\' 6"  (1.676 m)  Wt 165 lb 3.2 oz (74.934 kg)  BMI 26.66 kg/m2  LMP 08/30/2011 General:   Alert,  Well-developed, well-nourished, pleasant and cooperative in NAD Head:  Normocephalic and atraumatic. Eyes:  Sclera clear, no icterus.   Conjunctiva pink. Mouth:  No deformity or lesions, oropharynx pink and moist. Neck:  Supple; no masses or thyromegaly. Heart:  Regular rate and rhythm; no murmurs, clicks, rubs,  or gallops. Abdomen:  Soft and nondistended. + small circular ecchymosis below umbilicus on right.  +Murphy's pt tenderness, RUQ tenderness.  No masses, hepatosplenomegaly or hernias noted. Normal bowel sounds, without guarding, and without rebound.    Msk:  Symmetrical without gross deformities. Normal posture. Pulses:  Normal pulses noted. Extremities:  Without clubbing or edema. Neurologic:  Alert and  oriented x4;  grossly normal neurologically. Skin:  Intact without significant lesions or rashes. Cervical Nodes:  No significant cervical adenopathy. Psych:  Alert and cooperative. Normal mood and affect.           Glendora Score  09/13/2011  3:03 PM  Signed Cc to PCP  Jonette Eva, MD  09/14/2011  9:25 AM  Signed LAST EGD D75 V3 OCT 2011 CHRONIC GASTRITIS. PT C/O?MELENA AND CBC NL  Jonette Eva, MD  09/14/2011  9:25 AM  Signed EGD ASAP FOR  MELENA  JONES,  KANDICE, NP  09/14/2011  9:27 AM  Signed Please schedule EGD with Dr. Darrick Penna Friday re: melena Thanks  Cherene Julian [facility]  09/14/2011 10:30 AM  Signed Scheduled for 11/30@ 1:10     NONSPECIFIC ABN FINDNG RAD&OTH EXAM BILARY TRCT Lorenza Burton, NP  09/08/2011 10:05 PM  Signed ?biliary pain vs. pain from enlarging hepatic hemangioma.  GERD (gastroesophageal reflux disease) Lorenza Burton, NP  09/08/2011 10:13 PM  Addendum On omeprazole 20mg  bid.  Hx gastritis, candida esophagitis.  See epigastric pain.  Previous Version  CONSTIPATION Lorenza Burton, NP  09/08/2011 10:07 PM  Signed Improved.  ABDOMINAL PAIN - Lorenza Burton, NP  09/08/2011 10:12 PM  Addendum Epigastric/Upper Abd pain.  Differentials include biliary pain, enlarging hepatic hemangioma, pancreatitis, recurrent candida & gastritis.   CBC, amylase,lipase, U preg, LFTs To emergency room if pain becomes severe Continued omeprazole 20 mg twice a day Avoid anti-inflammatories like ibuprofen or aspirin products or Aleve for pain

## 2011-09-17 NOTE — Interval H&P Note (Signed)
History and Physical Interval Note:  09/17/2011 12:35 PM  [name]  has presented today for surgery, with the diagnosis of abd pain  The various methods of treatment have been discussed with the patient and family. After consideration of risks, benefits and other options for treatment, the patient has consented to  Procedure(s): ESOPHAGOGASTRODUODENOSCOPY (EGD) as a surgical intervention .  The patients' history has been reviewed, patient examined, no change in status, stable for surgery.  I have reviewed the patients' chart and labs.  Questions were answered to the patient's satisfaction.     [employer]

## 2011-09-20 ENCOUNTER — Encounter: Payer: Self-pay | Admitting: Gastroenterology

## 2011-09-21 NOTE — Progress Notes (Signed)
Results Cc to Dr Fagan 

## 2011-09-23 ENCOUNTER — Telehealth: Payer: Self-pay | Admitting: Gastroenterology

## 2011-09-23 NOTE — Telephone Encounter (Signed)
Patient is calling for Procedure results from 09/17/11 w/SLF

## 2011-09-24 ENCOUNTER — Telehealth: Payer: Self-pay | Admitting: Gastroenterology

## 2011-09-24 NOTE — Telephone Encounter (Signed)
Pt is aware of results. She will call back to make a follow up appointment.

## 2011-09-24 NOTE — Telephone Encounter (Signed)
Results Cc to PCP  

## 2011-09-24 NOTE — Telephone Encounter (Signed)
Please call pt. HER stomach Bx shows gastritis. HER ABDOMINAL PAIN IS FROM HER IBS AND GASTRITIS. Continue OMP 30 minutes prior to meals BID. CONTINUE FIBER SUPPLEMENTS. OPV IN 3 MOS.

## 2011-09-27 ENCOUNTER — Encounter (HOSPITAL_COMMUNITY): Payer: Self-pay | Admitting: Gastroenterology

## 2011-09-27 NOTE — Telephone Encounter (Signed)
See phone note of 09/24/2011. Pt was informed.

## 2011-09-30 NOTE — Telephone Encounter (Signed)
Pt is aware of OV on 12/22/11 at 1pm with KJ

## 2011-11-18 ENCOUNTER — Other Ambulatory Visit: Payer: Self-pay | Admitting: Gastroenterology

## 2011-11-18 ENCOUNTER — Ambulatory Visit (INDEPENDENT_AMBULATORY_CARE_PROVIDER_SITE_OTHER): Payer: Managed Care, Other (non HMO) | Admitting: Family Medicine

## 2011-11-18 DIAGNOSIS — R109 Unspecified abdominal pain: Secondary | ICD-10-CM

## 2011-11-18 DIAGNOSIS — G43909 Migraine, unspecified, not intractable, without status migrainosus: Secondary | ICD-10-CM | POA: Insufficient documentation

## 2011-11-18 LAB — POCT CBC
Granulocyte percent: 70.2 %G (ref 37–80)
HCT, POC: 43.8 % (ref 37.7–47.9)
Hemoglobin: 14 g/dL (ref 12.2–16.2)
Lymph, poc: 1.7 (ref 0.6–3.4)
MCH, POC: 28.1 pg (ref 27–31.2)
MCHC: 32 g/dL (ref 31.8–35.4)
MCV: 88 fL (ref 80–97)
MID (cbc): 0.5 (ref 0–0.9)
MPV: 9.2 fL (ref 0–99.8)
POC Granulocyte: 5.1 (ref 2–6.9)
POC LYMPH PERCENT: 23.4 %L (ref 10–50)
POC MID %: 6.4 %M (ref 0–12)
Platelet Count, POC: 306 10*3/uL (ref 142–424)
RBC: 4.98 M/uL (ref 4.04–5.48)
RDW, POC: 14.3 %
WBC: 7.2 10*3/uL (ref 4.6–10.2)

## 2011-11-18 MED ORDER — GI COCKTAIL ~~LOC~~
30.0000 mL | Freq: Once | ORAL | Status: AC
  Administered 2011-11-18: 30 mL via ORAL

## 2011-11-18 MED ORDER — SUCRALFATE 1 GM/10ML PO SUSP: 1.0000 g | Freq: Four times a day (QID) | ORAL | Status: DC

## 2011-11-18 NOTE — Patient Instructions (Signed)
Helicobacter Pylori and Ulcer Disease An ulcer may be in your stomach (gastric ulcer) or in the first part of your small bowel, which is called the duodenum (duodenal ulcer). An ulcer is a break in the stomach or duodenum lining. The break wears down into the deeper tissue. Helicobacter pylori (H. pylori) is a type of germ (bacteria) that may cause the majority of gastric or duodenal ulcers. CAUSES   A germ (bacterium). H. pylori can weaken the protective mucous coating of the stomach and duodenum. This allows acid to get through to the sensitive lining of the stomach or duodenum and an ulcer can then form.   Certain medications.   Using substances that can bother the lining of the stomach (alcohol, tobacco or medications such as Advil or Motrin) in the presence of H.pylori infection. This can increase the chances of getting an ulcer.   Cancer (rarely).  Most people infected with H. pylori do not get ulcers. It is not known how people catch H. pylori. It may be through food or water. H. pylori has been found in the saliva of some infected people. Therefore, the bacteria may also spread through mouth-to-mouth contact such as kissing. SYMPTOMS  The problems (symptoms) of ulcer disease are usually:  A burning or gnawing of the mid-upper belly (abdomen). This is often worse on an empty stomach. It may get better with food. This may be associated with feeling sick to your stomach (nausea), bloating and vomiting.   If the ulcer results in bleeding, it can cause:   Black, tarry stools.   Throwing up bright red blood.   Throwing up coffee ground looking materials.  With severe bleeding, there may be loss of consciousness and shock. Besides ulcer disease, H. pylori can also cause chronic gastritis (irritation of the lining of the stomach without ulcer) or stomach acid-type discomfort. You may not have symptoms even though you have an H. pylori infection. Although this is an infection, you may not  have usual infection symptoms (such as fever). DIAGNOSIS  Ulcer disease can be diagnosed in many different ways. If you have an ulcer, it is important to know whether or not it is caused by H. Pylori. Treatment for an ulcer caused by H. pylori is different from that for an ulcer with other causes. The best way to detect H. pylori is taking tissue directly from the ulcer during an endoscopy test.   An endoscopy is an exam that uses an endoscope. This is a thin, lighted tube with a small camera on the end. It is like a flexible telescope. The patient is given a drug to make them calm (sedative). The caregiver eases the endoscope into the mouth and down the throat to the stomach and duodenum. This allows the doctor to see the lining of the esophagus, stomach and duodenum.   If an endoscopy is not needed, then H. pylori can be detected with tests of the blood, stool or even breath.  TREATMENT   H. pylori peptic ulcer treatment usually involves a combination of:   Medicines that kill germs (antibiotics).   Acid suppressors.   Stomach protectors.   The use of only one medication to treat H. pylori is not recommended. The most proven treatment is a 2 week course of treatment called triple therapy. It involves taking two antibiotics to kill the bacteria and either an acid suppressor or stomach-lining shield. Two-week triple therapy reduces ulcer symptoms, kills the bacteria, and prevents ulcers from coming back in many   patients.   Unfortunately, patients may find triple therapy hard to do. This is because it involves taking as many as 20 pills a day. Also, the antibiotics used in triple therapy may cause mild side effects. These include nausea, vomiting, diarrhea, dark stools, a metallic taste in the mouth, dizziness, headache and yeast infections in women. Talk to your caregiver if you have any of these side effects.  HOME CARE INSTRUCTIONS   Take your medications as directed and for as long as  prescribed. Contact your caregiver if you have problems or side effects from your medications.   Continue regular work and usual activities unless told otherwise by your caregiver.   Avoid tobacco, alcohol and caffeine. Tobacco use will decrease and slow healing.   Avoid medications that are harmful. This includes aspirin and NSAIDS such as ibuprofen and naproxen.   Avoid foods that seem to aggravate or cause discomfort.   There are many over-the-counter products available to control stomach acid and other symptoms. Discuss these with your caregiver before using them. Do not  stop taking prescription medications for over-the-counter medications without talking with your caregiver.   Special diets are not usually needed.   Keep any follow-up appointments and blood tests as directed.  SEEK MEDICAL CARE IF:   Your pain or other ulcer symptoms do not improve within a few days of starting treatment.   You develop diarrhea. This can be a problem related to certain treatments.   You have ongoing indigestion or heartburn even if your main ulcer symptoms are improved.   You think you have any side effects from your medications or if you do not understand how to use your medications right.  SEEK IMMEDIATE MEDICAL CARE IF:  Any of the following happen:  You develop bright red, rectal bleeding.   You develop dark black, tarry stools.   You throw up (vomit) blood.   You become light-headed, weak, have fainting episodes, or become sweaty, cold and clammy.   You have severe abdominal pain not controlled by medications. Do not take pain medications unless ordered by your caregiver.  MAKE SURE YOU:   Understand these instructions.   Will watch your condition.   Will get help right away if you are not doing well or get worse.  Document Released: 12/25/2003 Document Revised: 06/16/2011 Document Reviewed: 05/23/2008 [employer] Patient Information 2012 [employer]. 

## 2011-11-18 NOTE — Progress Notes (Signed)
  Subjective:    Patient ID: [name], female    DOB: Dec 22, 1965, 46 y.o.   MRN: [id number]  Abdominal Pain This is a new problem. The current episode started in the past 7 days. The onset quality is sudden. The problem occurs constantly. The problem has been unchanged. The pain is located in the epigastric region. The pain is at a severity of 8/10. The pain is moderate. The quality of the pain is burning. The abdominal pain does not radiate. Associated symptoms include headaches. Pertinent negatives include no anorexia, constipation, diarrhea, dysuria, fever or hematochezia. The pain is aggravated by eating. The pain is relieved by nothing. Her past medical history is significant for GERD.    Patient has had an abnormal gallbladder U/S.  She underwent endoscopy last fall which showed gastritis.  Unsure about past blood work.  Some elevation of pancreatic enzymes in past.  Review of Systems  Constitutional: Negative for fever.  Gastrointestinal: Positive for abdominal pain. Negative for diarrhea, constipation, hematochezia and anorexia.  Genitourinary: Negative for dysuria.  Neurological: Positive for headaches.       Objective:   Physical Exam  Constitutional: She is oriented to person, place, and time. She appears well-developed and well-nourished.  HENT:  Head: Normocephalic and atraumatic.  Eyes: Conjunctivae and EOM are normal. Pupils are equal, round, and reactive to light.  Neck: Normal range of motion. Neck supple.  Cardiovascular: Normal rate, regular rhythm and normal heart sounds.   Abdominal: Soft. There is tenderness (entire epigastrium).  Musculoskeletal: Normal range of motion.  Neurological: She is alert and oriented to person, place, and time.  Skin: Skin is warm and dry.    Patient experienced no relief with GI cocktail      Assessment & Plan:  Epigastric pain with h/o GERD, abnormal pancreatic enzymes, and abnormal gall bladder.

## 2011-11-19 ENCOUNTER — Telehealth: Payer: Self-pay

## 2011-11-19 ENCOUNTER — Ambulatory Visit
Admission: RE | Admit: 2011-11-19 | Discharge: 2011-11-19 | Disposition: A | Discharge status: Home / Self Care | Payer: Managed Care, Other (non HMO) | Source: Ambulatory Visit | Attending: Family Medicine | Admitting: Family Medicine

## 2011-11-19 DIAGNOSIS — K829 Disease of gallbladder, unspecified: Secondary | ICD-10-CM

## 2011-11-19 LAB — H. PYLORI ANTIBODY, IGG: H Pylori IgG: 0.83 {ISR}

## 2011-11-19 LAB — COMPREHENSIVE METABOLIC PANEL
ALT: 12 U/L (ref 0–35)
AST: 18 U/L (ref 0–37)
Albumin: 4.8 g/dL (ref 3.5–5.2)
Alkaline Phosphatase: 108 U/L (ref 39–117)
BUN: 10 mg/dL (ref 6–23)
CO2: 22 mEq/L (ref 19–32)
Calcium: 9.7 mg/dL (ref 8.4–10.5)
Chloride: 106 mEq/L (ref 96–112)
Creat: 0.59 mg/dL (ref 0.50–1.10)
Glucose, Bld: 92 mg/dL (ref 70–99)
Potassium: 4.2 mEq/L (ref 3.5–5.3)
Sodium: 140 mEq/L (ref 135–145)
Total Bilirubin: 0.5 mg/dL (ref 0.3–1.2)
Total Protein: 7.3 g/dL (ref 6.0–8.3)

## 2011-11-19 LAB — LIPASE: Lipase: 95 U/L — ABNORMAL HIGH (ref 0–75)

## 2011-11-19 NOTE — Telephone Encounter (Signed)
.  [facility] PT STATES SHE WAS SEEN YESTERDAY - AND WAS TO BE REFERRED OUT FOR ULTRA SOUND PLEASE CALL  [phone]

## 2011-11-19 NOTE — Telephone Encounter (Signed)
Spoke with patient. Scheduled for ultra sound at [facility] 1045am Friday 11/19/2011. Results show inflammation of the gallbladder per Dr Milus Glazier. He has put in an order for a referral to a general surgeon in the [place] area. Patient has been notified of the plan via her husband. Agree with plan and will call back if need anything before referral is complete.

## 2011-11-30 ENCOUNTER — Encounter (HOSPITAL_COMMUNITY): Payer: Self-pay | Admitting: Pharmacy Technician

## 2011-12-01 ENCOUNTER — Other Ambulatory Visit: Payer: Self-pay

## 2011-12-01 ENCOUNTER — Encounter (HOSPITAL_COMMUNITY)
Admission: RE | Admit: 2011-12-01 | Discharge: 2011-12-01 | Disposition: A | Discharge status: Home / Self Care | Payer: Managed Care, Other (non HMO) | Source: Ambulatory Visit | Attending: General Surgery | Admitting: General Surgery

## 2011-12-01 ENCOUNTER — Encounter (HOSPITAL_COMMUNITY): Payer: Self-pay

## 2011-12-01 HISTORY — DX: Other specified postprocedural states: Z98.890

## 2011-12-01 HISTORY — DX: Other specified postprocedural states: R11.2

## 2011-12-01 LAB — CBC
HCT: 36.2 % (ref 36.0–46.0)
MCHC: 34.3 g/dL (ref 30.0–36.0)
Platelets: 259 10*3/uL (ref 150–400)
RDW: 13.5 % (ref 11.5–15.5)
WBC: 4.7 10*3/uL (ref 4.0–10.5)

## 2011-12-01 LAB — DIFFERENTIAL
Basophils Absolute: 0 10*3/uL (ref 0.0–0.1)
Basophils Relative: 0 % (ref 0–1)
Lymphocytes Relative: 23 % (ref 12–46)
Monocytes Absolute: 0.4 10*3/uL (ref 0.1–1.0)
Neutro Abs: 3.1 10*3/uL (ref 1.7–7.7)
Neutrophils Relative %: 66 % (ref 43–77)

## 2011-12-01 LAB — BASIC METABOLIC PANEL
CO2: 26 mEq/L (ref 19–32)
Chloride: 107 mEq/L (ref 96–112)
Creatinine, Ser: 0.62 mg/dL (ref 0.50–1.10)
GFR calc Af Amer: >90 mL/min (ref >90)
Sodium: 140 mEq/L (ref 135–145)

## 2011-12-01 LAB — SURGICAL PCR SCREEN
MRSA, PCR: NEGATIVE
Staphylococcus aureus: NEGATIVE

## 2011-12-01 LAB — HCG, QUANTITATIVE, PREGNANCY: hCG, Beta Chain, Quant, S: <1 m[IU]/mL (ref <5)

## 2011-12-01 NOTE — H&P (Signed)
  NTS SOAP Note  Vital Signs:  Vitals as of: 11/30/2011: Systolic 142: Diastolic 72: Heart Rate 67: Temp 96.74F: Height 79ft 6in: Weight 161Lbs 0 Ounces: OFC 0in: Respiratory Rate 0: O2 Saturation 0: Pain Level 3: BMI 26  BMI : 25.99 kg/m2  Subjective: This 68 Years 44 Months old Female presents for of epigastric pain. Pain worse with food intake.  Pain colicky in nature.  Symptoms have occurred for last 2 years.  Pain does not radiate.  +bloating.  +family history.  + native [place] history. No nausea.  No emesis  NO jaundice.    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 Skin:no rash or prominent lesions Head:Atraumatic; no masses; no abnormalities Eyes:conjunctiva clear, EOM intact, PERRL Mouth:Mucous membranes moist, no mucosal lesions. Throat:no erythema, exudates or lesions. Neck:Supple without lymphadenopathy.  Heart:RRR, no murmur Lungs:CTA bilaterally, no wheezes, rhonchi, rales.  Breathing  unlabored. Abdomen:Soft, ND, no HSM, no masses.mild RUQ tendserness.  No peritoneal signs. Extremities:No deformities, clubbing, cyanosis, or edema.   Assessment:  Diagnosis &amp; Procedure: DiagnosisCode: 574.00, ProcedureCode: 16109,    Plan: Cholelithiasis.  Surgical options discussed with the patient.  Will schedule at patient's convenience.  Patient Education:Alternative treatments to surgery were discussed with patient (and family).Risks and benefits  of procedure were fully explained to the patient (and family) who gave informed consent. Patient/family questions were addressed.  Follow-up:Pending Surgery

## 2011-12-01 NOTE — Patient Instructions (Addendum)
18 [name]  12/01/2011   Your procedure is scheduled on:  12/06/11  Report to [facility] at 06:15 AM.  Call this number if you have problems the morning of surgery: [phone]   Remember:   Do not eat food:After Midnight.  May have clear liquids:until Midnight .  Clear liquids include soda, tea, black coffee, apple or grape juice, broth.  Take these medicines the morning of surgery with A SIP OF WATER: Omeprazole and Nabumetone. Take your Phenergan only if needed.   Do not wear jewelry, make-up or nail polish.  Do not wear lotions, powders, or perfumes. You may wear deodorant.  Do not shave 48 hours prior to surgery.  Do not bring valuables to the hospital.  Contacts, dentures or bridgework may not be worn into surgery.  Leave suitcase in the car. After surgery it may be brought to your room.  For patients admitted to the hospital, checkout time is 11:00 AM the day of discharge.   Patients discharged the day of surgery will not be allowed to drive home.  Name and phone number of your driver:   Special Instructions: CHG Shower Use Special Wash: 1/2 bottle night before surgery and 1/2 bottle morning of surgery.   Please read over the following fact sheets that you were given: Pain Booklet, MRSA Information, Surgical Site Infection Prevention, Anesthesia Post-op Instructions and Care and Recovery After Surgery    Laparoscopic Cholecystectomy Laparoscopic cholecystectomy is surgery to remove the gallbladder. The gallbladder is located slightly to the right of center in the abdomen, behind the liver. It is a concentrating and storage sac for the bile produced in the liver. Bile aids in the digestion and absorption of fats. Gallbladder disease (cholecystitis) is an inflammation of your gallbladder. This condition is usually caused by a buildup of gallstones (cholelithiasis) in your gallbladder. Gallstones can block the flow of bile, resulting in inflammation and pain. In severe cases,  emergency surgery may be required. When emergency surgery is not required, you will have time to prepare for the procedure. Laparoscopic surgery is an alternative to open surgery. Laparoscopic surgery usually has a shorter recovery time. Your common bile duct may also need to be examined and explored. Your caregiver will discuss this with you if he or she feels this should be done. If stones are found in the common bile duct, they may be removed. LET YOUR CAREGIVER KNOW ABOUT:  Allergies to food or medicine.   Medicines taken, including vitamins, herbs, eyedrops, over-the-counter medicines, and creams.   Use of steroids (by mouth or creams).   Previous problems with anesthetics or numbing medicines.   History of bleeding problems or blood clots.   Previous surgery.   Other health problems, including diabetes and kidney problems.   Possibility of pregnancy, if this applies.  RISKS AND COMPLICATIONS All surgery is associated with risks. Some problems that may occur following this procedure include:  Infection.   Damage to the common bile duct, nerves, arteries, veins, or other internal organs such as the stomach or intestines.   Bleeding.   A stone may remain in the common bile duct.  BEFORE THE PROCEDURE  Do not take aspirin for 3 days prior to surgery or blood thinners for 1 week prior to surgery.   Do not eat or drink anything after midnight the night before surgery.   Let your caregiver know if you develop a cold or other infectious problem prior to surgery.   You should be present  60 minutes before the procedure or as directed.  PROCEDURE  You will be given medicine that makes you sleep (general anesthetic). When you are asleep, your surgeon will make several small cuts (incisions) in your abdomen. One of these incisions is used to insert a small, lighted scope (laparoscope) into the abdomen. The laparoscope helps the surgeon see into your abdomen. Carbon dioxide gas will be  pumped into your abdomen. The gas allows more room for the surgeon to perform your surgery. Other operating instruments are inserted through the other incisions. Laparoscopic procedures may not be appropriate when:  There is major scarring from previous surgery.   The gallbladder is extremely inflamed.   There are bleeding disorders or unexpected cirrhosis of the liver.   A pregnancy is near term.   Other conditions make the laparoscopic procedure impossible.  If your surgeon feels it is not safe to continue with a laparoscopic procedure, he or she will perform an open abdominal procedure. In this case, the surgeon will make an incision to open the abdomen. This gives the surgeon a larger view and field to work within. This may allow the surgeon to perform procedures that sometimes cannot be performed with a laparoscope alone. Open surgery has a longer recovery time. AFTER THE PROCEDURE  You will be taken to the recovery area where a nurse will watch and check your progress.   You may be allowed to go home the same day.   Do not resume physical activities until directed by your caregiver.   You may resume a normal diet and activities as directed.  Document Released: 10/04/2005 Document Revised: 06/16/2011 Document Reviewed: 03/19/2011 [facility] Patient Information 2012 [place], [place].   PATIENT INSTRUCTIONS POST-ANESTHESIA  IMMEDIATELY FOLLOWING SURGERY:  Do not drive or operate machinery for the first twenty four hours after surgery.  Do not make any important decisions for twenty four hours after surgery or while taking narcotic pain medications or sedatives.  If you develop intractable nausea and vomiting or a severe headache please notify your doctor immediately.  FOLLOW-UP:  Please make an appointment with your surgeon as instructed. You do not need to follow up with anesthesia unless specifically instructed to do so.  WOUND CARE INSTRUCTIONS (if applicable):  Keep a dry clean  dressing on the anesthesia/puncture wound site if there is drainage.  Once the wound has quit draining you may leave it open to air.  Generally you should leave the bandage intact for twenty four hours unless there is drainage.  If the epidural site drains for more than 36-48 hours please call the anesthesia department.  QUESTIONS?:  Please feel free to call your physician or the hospital operator if you have any questions, and they will be happy to assist you.     [facility] Anesthesia Department [address] [place] [phone]

## 2011-12-06 ENCOUNTER — Encounter (HOSPITAL_COMMUNITY)
Admission: RE | Disposition: A | Discharge status: Home / Self Care | Payer: Self-pay | Source: Ambulatory Visit | Attending: General Surgery

## 2011-12-06 ENCOUNTER — Ambulatory Visit (HOSPITAL_COMMUNITY)
Admission: RE | Admit: 2011-12-06 | Discharge: 2011-12-06 | Disposition: A | Discharge status: Home / Self Care | Payer: Managed Care, Other (non HMO) | Source: Ambulatory Visit | Attending: General Surgery | Admitting: General Surgery

## 2011-12-06 ENCOUNTER — Encounter (HOSPITAL_COMMUNITY): Payer: Self-pay | Admitting: Anesthesiology

## 2011-12-06 ENCOUNTER — Ambulatory Visit (HOSPITAL_COMMUNITY): Payer: Managed Care, Other (non HMO) | Admitting: Anesthesiology

## 2011-12-06 ENCOUNTER — Encounter (HOSPITAL_COMMUNITY): Payer: Self-pay | Admitting: *Deleted

## 2011-12-06 ENCOUNTER — Other Ambulatory Visit: Payer: Self-pay | Admitting: General Surgery

## 2011-12-06 DIAGNOSIS — I10 Essential (primary) hypertension: Secondary | ICD-10-CM | POA: Insufficient documentation

## 2011-12-06 DIAGNOSIS — Z79899 Other long term (current) drug therapy: Secondary | ICD-10-CM | POA: Insufficient documentation

## 2011-12-06 DIAGNOSIS — K819 Cholecystitis, unspecified: Secondary | ICD-10-CM | POA: Insufficient documentation

## 2011-12-06 DIAGNOSIS — Z01810 Encounter for preprocedural cardiovascular examination: Secondary | ICD-10-CM | POA: Insufficient documentation

## 2011-12-06 DIAGNOSIS — Z01812 Encounter for preprocedural laboratory examination: Secondary | ICD-10-CM | POA: Insufficient documentation

## 2011-12-06 HISTORY — PX: CHOLECYSTECTOMY: SHX55

## 2011-12-06 SURGERY — LAPAROSCOPIC CHOLECYSTECTOMY
Anesthesia: General | Site: Abdomen | Wound class: Clean Contaminated

## 2011-12-06 MED ORDER — FENTANYL CITRATE 0.05 MG/ML IJ SOLN
INTRAMUSCULAR | Status: DC | PRN
  Administered 2011-12-06: 100 ug via INTRAVENOUS
  Administered 2011-12-06: 150 ug via INTRAVENOUS
  Administered 2011-12-06: 50 ug via INTRAVENOUS

## 2011-12-06 MED ORDER — PROMETHAZINE HCL 25 MG/ML IJ SOLN
INTRAMUSCULAR | Status: AC
  Administered 2011-12-06: 6.25 mg via INTRAVENOUS
  Filled 2011-12-06: qty 1

## 2011-12-06 MED ORDER — SCOPOLAMINE 1 MG/3DAYS TD PT72
1.0000 | MEDICATED_PATCH | Freq: Once | TRANSDERMAL | Status: DC
  Administered 2011-12-06: 1.5 mg via TRANSDERMAL

## 2011-12-06 MED ORDER — SCOPOLAMINE 1 MG/3DAYS TD PT72
MEDICATED_PATCH | TRANSDERMAL | Status: AC
  Filled 2011-12-06: qty 1

## 2011-12-06 MED ORDER — LIDOCAINE HCL 1 % IJ SOLN
INTRAMUSCULAR | Status: DC | PRN
  Administered 2011-12-06: 30 mg via INTRADERMAL

## 2011-12-06 MED ORDER — BUPIVACAINE HCL (PF) 0.5 % IJ SOLN
INTRAMUSCULAR | Status: AC
  Filled 2011-12-06: qty 30

## 2011-12-06 MED ORDER — SODIUM CHLORIDE 0.9 % IR SOLN
Status: DC | PRN
  Administered 2011-12-06: 1000 mL

## 2011-12-06 MED ORDER — PROMETHAZINE HCL 25 MG/ML IJ SOLN
6.2500 mg | Freq: Once | INTRAMUSCULAR | Status: AC
  Administered 2011-12-06: 6.25 mg via INTRAVENOUS

## 2011-12-06 MED ORDER — CEFAZOLIN SODIUM 1-5 GM-% IV SOLN: 1.0000 g | INTRAVENOUS | Status: DC

## 2011-12-06 MED ORDER — ONDANSETRON HCL 4 MG/2ML IJ SOLN
4.0000 mg | Freq: Once | INTRAMUSCULAR | Status: AC
  Administered 2011-12-06: 4 mg via INTRAVENOUS

## 2011-12-06 MED ORDER — PROPOFOL 10 MG/ML IV BOLUS
INTRAVENOUS | Status: DC | PRN
  Administered 2011-12-06: 160 mg via INTRAVENOUS

## 2011-12-06 MED ORDER — GLYCOPYRROLATE 0.2 MG/ML IJ SOLN
INTRAMUSCULAR | Status: AC
  Filled 2011-12-06: qty 1

## 2011-12-06 MED ORDER — DEXAMETHASONE SODIUM PHOSPHATE 4 MG/ML IJ SOLN
INTRAMUSCULAR | Status: AC
  Filled 2011-12-06: qty 1

## 2011-12-06 MED ORDER — LACTATED RINGERS IV SOLN: INTRAVENOUS | Status: DC

## 2011-12-06 MED ORDER — FENTANYL CITRATE 0.05 MG/ML IJ SOLN
INTRAMUSCULAR | Status: AC
  Administered 2011-12-06: 50 ug via INTRAVENOUS
  Filled 2011-12-06: qty 2

## 2011-12-06 MED ORDER — MIDAZOLAM HCL 2 MG/2ML IJ SOLN
INTRAMUSCULAR | Status: AC
  Filled 2011-12-06: qty 2

## 2011-12-06 MED ORDER — ONDANSETRON HCL 4 MG/2ML IJ SOLN
INTRAMUSCULAR | Status: AC
  Administered 2011-12-06: 4 mg via INTRAVENOUS
  Filled 2011-12-06: qty 2

## 2011-12-06 MED ORDER — HYDROCODONE-ACETAMINOPHEN 5-325 MG PO TABS: 1.0000 | ORAL_TABLET | ORAL | Status: AC | PRN

## 2011-12-06 MED ORDER — GLYCOPYRROLATE 0.2 MG/ML IJ SOLN
INTRAMUSCULAR | Status: DC | PRN
  Administered 2011-12-06: .4 mg via INTRAVENOUS

## 2011-12-06 MED ORDER — PROPOFOL 10 MG/ML IV EMUL
INTRAVENOUS | Status: AC
  Filled 2011-12-06: qty 20

## 2011-12-06 MED ORDER — FENTANYL CITRATE 0.05 MG/ML IJ SOLN
25.0000 ug | INTRAMUSCULAR | Status: DC | PRN
  Administered 2011-12-06 (×4): 50 ug via INTRAVENOUS

## 2011-12-06 MED ORDER — ENOXAPARIN SODIUM 40 MG/0.4ML ~~LOC~~ SOLN
SUBCUTANEOUS | Status: AC
  Administered 2011-12-06: 40 mg via SUBCUTANEOUS
  Filled 2011-12-06: qty 0.4

## 2011-12-06 MED ORDER — DEXAMETHASONE SODIUM PHOSPHATE 4 MG/ML IJ SOLN
4.0000 mg | Freq: Once | INTRAMUSCULAR | Status: AC
  Administered 2011-12-06: 4 mg via INTRAVENOUS

## 2011-12-06 MED ORDER — ROCURONIUM BROMIDE 100 MG/10ML IV SOLN
INTRAVENOUS | Status: DC | PRN
  Administered 2011-12-06: 35 mg via INTRAVENOUS

## 2011-12-06 MED ORDER — LIDOCAINE HCL (PF) 1 % IJ SOLN
INTRAMUSCULAR | Status: AC
  Filled 2011-12-06: qty 5

## 2011-12-06 MED ORDER — GLYCOPYRROLATE 0.2 MG/ML IJ SOLN
0.2000 mg | Freq: Once | INTRAMUSCULAR | Status: AC
  Administered 2011-12-06: 0.2 mg via INTRAVENOUS

## 2011-12-06 MED ORDER — NEOSTIGMINE METHYLSULFATE 1 MG/ML IJ SOLN
INTRAMUSCULAR | Status: DC | PRN
  Administered 2011-12-06: 3 mg via INTRAVENOUS

## 2011-12-06 MED ORDER — ROCURONIUM BROMIDE 50 MG/5ML IV SOLN
INTRAVENOUS | Status: AC
  Filled 2011-12-06: qty 1

## 2011-12-06 MED ORDER — CEFAZOLIN SODIUM 1-5 GM-% IV SOLN
INTRAVENOUS | Status: AC
  Filled 2011-12-06: qty 50

## 2011-12-06 MED ORDER — ONDANSETRON HCL 4 MG/2ML IJ SOLN
INTRAMUSCULAR | Status: AC
  Filled 2011-12-06: qty 2

## 2011-12-06 MED ORDER — LACTATED RINGERS IV SOLN
INTRAVENOUS | Status: DC
  Administered 2011-12-06: 1000 mL via INTRAVENOUS

## 2011-12-06 MED ORDER — MIDAZOLAM HCL 2 MG/2ML IJ SOLN
1.0000 mg | INTRAMUSCULAR | Status: DC | PRN
  Administered 2011-12-06: 2 mg via INTRAVENOUS

## 2011-12-06 MED ORDER — MIDAZOLAM HCL 5 MG/5ML IJ SOLN
INTRAMUSCULAR | Status: DC | PRN
  Administered 2011-12-06: 2 mg via INTRAVENOUS

## 2011-12-06 MED ORDER — SODIUM CHLORIDE 0.9 % IV SOLN: 6.2500 mg | Freq: Once | INTRAVENOUS | Status: DC

## 2011-12-06 MED ORDER — ENOXAPARIN SODIUM 40 MG/0.4ML ~~LOC~~ SOLN
40.0000 mg | Freq: Once | SUBCUTANEOUS | Status: AC
  Administered 2011-12-06: 40 mg via SUBCUTANEOUS

## 2011-12-06 MED ORDER — CEFAZOLIN SODIUM 1-5 GM-% IV SOLN
INTRAVENOUS | Status: DC | PRN
  Administered 2011-12-06: 1 g via INTRAVENOUS

## 2011-12-06 MED ORDER — FENTANYL CITRATE 0.05 MG/ML IJ SOLN
INTRAMUSCULAR | Status: AC
  Administered 2011-12-06: 50 ug via INTRAVENOUS
  Filled 2011-12-06: qty 5

## 2011-12-06 MED ORDER — BUPIVACAINE HCL (PF) 0.5 % IJ SOLN
INTRAMUSCULAR | Status: DC | PRN
  Administered 2011-12-06: 10 mL

## 2011-12-06 MED ORDER — ONDANSETRON HCL 4 MG/2ML IJ SOLN: 4.0000 mg | Freq: Once | INTRAMUSCULAR | Status: DC | PRN

## 2011-12-06 SURGICAL SUPPLY — 34 items
APPLIER CLIP UNV 5X34 EPIX (ENDOMECHANICALS) ×2 IMPLANT
BAG HAMPER (MISCELLANEOUS) ×2 IMPLANT
BENZOIN TINCTURE PRP APPL 2/3 (GAUZE/BANDAGES/DRESSINGS) ×2 IMPLANT
CLOTH BEACON ORANGE TIMEOUT ST (SAFETY) ×2 IMPLANT
COVER LIGHT HANDLE STERIS (MISCELLANEOUS) ×4 IMPLANT
DECANTER SPIKE VIAL GLASS SM (MISCELLANEOUS) ×2 IMPLANT
DEVICE TROCAR PUNCTURE CLOSURE (ENDOMECHANICALS) ×2 IMPLANT
DURAPREP 26ML APPLICATOR (WOUND CARE) ×2 IMPLANT
ELECT REM PT RETURN 9FT ADLT (ELECTROSURGICAL) ×2
ELECTRODE REM PT RTRN 9FT ADLT (ELECTROSURGICAL) ×1 IMPLANT
FILTER SMOKE EVAC LAPAROSHD (FILTER) ×2 IMPLANT
FORMALIN 10 PREFIL 120ML (MISCELLANEOUS) ×2 IMPLANT
GLOVE BIOGEL PI IND STRL 7.5 (GLOVE) ×1 IMPLANT
GLOVE BIOGEL PI INDICATOR 7.5 (GLOVE) ×1
GLOVE ECLIPSE 6.5 STRL STRAW (GLOVE) ×4 IMPLANT
GLOVE ECLIPSE 7.0 STRL STRAW (GLOVE) ×2 IMPLANT
GLOVE INDICATOR 7.0 STRL GRN (GLOVE) ×4 IMPLANT
GOWN STRL REIN XL XLG (GOWN DISPOSABLE) ×6 IMPLANT
HEMOSTAT SNOW SURGICEL 2X4 (HEMOSTASIS) ×2 IMPLANT
INST SET LAPROSCOPIC AP (KITS) ×2 IMPLANT
IV NS IRRIG 3000ML ARTHROMATIC (IV SOLUTION) ×2 IMPLANT
KIT ROOM TURNOVER APOR (KITS) ×2 IMPLANT
KIT TROCAR LAP CHOLE (TROCAR) ×2 IMPLANT
MANIFOLD NEPTUNE II (INSTRUMENTS) ×2 IMPLANT
PACK LAP CHOLE LZT030E (CUSTOM PROCEDURE TRAY) ×2 IMPLANT
PAD ARMBOARD 7.5X6 YLW CONV (MISCELLANEOUS) ×2 IMPLANT
POUCH SPECIMEN RETRIEVAL 10MM (ENDOMECHANICALS) ×2 IMPLANT
SET BASIN LINEN APH (SET/KITS/TRAYS/PACK) ×2 IMPLANT
SET TUBE IRRIG SUCTION NO TIP (IRRIGATION / IRRIGATOR) IMPLANT
STRIP CLOSURE SKIN 1/2X4 (GAUZE/BANDAGES/DRESSINGS) ×2 IMPLANT
SUT MNCRL AB 4-0 PS2 18 (SUTURE) ×4 IMPLANT
SUT VIC AB 2-0 CT2 27 (SUTURE) ×4 IMPLANT
SYR 50ML LL SCALE MARK (SYRINGE) ×2 IMPLANT
WARMER LAPAROSCOPE (MISCELLANEOUS) ×2 IMPLANT

## 2011-12-06 NOTE — Anesthesia Preprocedure Evaluation (Signed)
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),    GI/Hepatic GERD-  Medicated and Controlled,  Endo/Other    Renal/GU      Musculoskeletal   Abdominal   Peds  Hematology   Anesthesia Other Findings   Reproductive/Obstetrics                           Anesthesia Physical Anesthesia Plan  ASA: III  Anesthesia Plan: General   Post-op Pain Management:    Induction: Intravenous, Rapid sequence and Cricoid pressure planned  Airway Management Planned:   Additional Equipment:   Intra-op Plan:   Post-operative Plan: Extubation in OR  Informed Consent: I have reviewed the patients History and Physical, chart, labs and discussed the procedure including the risks, benefits and alternatives for the proposed anesthesia with the patient or authorized representative who has indicated his/her understanding and acceptance.     Plan Discussed with:   Anesthesia Plan Comments:         Anesthesia Quick Evaluation

## 2011-12-06 NOTE — Discharge Instructions (Signed)
Laparoscopic Cholecystectomy Laparoscopic cholecystectomy is surgery to remove the gallbladder. The gallbladder is located slightly to the right of center in the abdomen, behind the liver. It is a concentrating and storage sac for the bile produced in the liver. Bile aids in the digestion and absorption of fats. Gallbladder disease (cholecystitis) is an inflammation of your gallbladder. This condition is usually caused by a buildup of gallstones (cholelithiasis) in your gallbladder. Gallstones can block the flow of bile, resulting in inflammation and pain. In severe cases, emergency surgery may be required. When emergency surgery is not required, you will have time to prepare for the procedure. Laparoscopic surgery is an alternative to open surgery. Laparoscopic surgery usually has a shorter recovery time. Your common bile duct may also need to be examined and explored. Your caregiver will discuss this with you if he or she feels this should be done. If stones are found in the common bile duct, they may be removed. LET YOUR CAREGIVER KNOW ABOUT:  Allergies to food or medicine.   Medicines taken, including vitamins, herbs, eyedrops, over-the-counter medicines, and creams.   Use of steroids (by mouth or creams).   Previous problems with anesthetics or numbing medicines.   History of bleeding problems or blood clots.   Previous surgery.   Other health problems, including diabetes and kidney problems.   Possibility of pregnancy, if this applies.  RISKS AND COMPLICATIONS All surgery is associated with risks. Some problems that may occur following this procedure include:  Infection.   Damage to the common bile duct, nerves, arteries, veins, or other internal organs such as the stomach or intestines.   Bleeding.   A stone may remain in the common bile duct.  BEFORE THE PROCEDURE  Do not take aspirin for 3 days prior to surgery or blood thinners for 1 week prior to surgery.   Do not eat or  drink anything after midnight the night before surgery.   Let your caregiver know if you develop a cold or other infectious problem prior to surgery.   You should be present 60 minutes before the procedure or as directed.  PROCEDURE  You will be given medicine that makes you sleep (general anesthetic). When you are asleep, your surgeon will make several small cuts (incisions) in your abdomen. One of these incisions is used to insert a small, lighted scope (laparoscope) into the abdomen. The laparoscope helps the surgeon see into your abdomen. Carbon dioxide gas will be pumped into your abdomen. The gas allows more room for the surgeon to perform your surgery. Other operating instruments are inserted through the other incisions. Laparoscopic procedures may not be appropriate when:  There is major scarring from previous surgery.   The gallbladder is extremely inflamed.   There are bleeding disorders or unexpected cirrhosis of the liver.   A pregnancy is near term.   Other conditions make the laparoscopic procedure impossible.  If your surgeon feels it is not safe to continue with a laparoscopic procedure, he or she will perform an open abdominal procedure. In this case, the surgeon will make an incision to open the abdomen. This gives the surgeon a larger view and field to work within. This may allow the surgeon to perform procedures that sometimes cannot be performed with a laparoscope alone. Open surgery has a longer recovery time. AFTER THE PROCEDURE  You will be taken to the recovery area where a nurse will watch and check your progress.   You may be allowed to go home   the same day.   Do not resume physical activities until directed by your caregiver.   You may resume a normal diet and activities as directed.  Document Released: 10/04/2005 Document Revised: 06/16/2011 Document Reviewed: 03/19/2011 [facility] Patient Information 2012 [place], [place].Instructions Following General  Anesthetic, Adult A nurse specialized in giving anesthesia (anesthetist) or a doctor specialized in giving anesthesia (anesthesiologist) gave you a medicine that made you sleep while a procedure was performed. For as long as 24 hours following this procedure, you may feel:  Dizzy.   Weak.   Drowsy.  AFTER THE PROCEDURE After surgery, you will be taken to the recovery area where a nurse will monitor your progress. You will be allowed to go home when you are awake, stable, taking fluids well, and without complications. For the first 24 hours following an anesthetic:  Have a responsible person with you.   Do not drive a car. If you are alone, do not take public transportation.   Do not drink alcohol.   Do not take medicine that has not been prescribed by your caregiver.   Do not sign important papers or make important decisions.   You may resume normal diet and activities as directed.   Change bandages (dressings) as directed.   Only take over-the-counter or prescription medicines for pain, discomfort, or fever as directed by your caregiver.  If you have questions or problems that seem related to the anesthetic, call the hospital and ask for the anesthetist or anesthesiologist on call. SEEK IMMEDIATE MEDICAL CARE IF:   You develop a rash.   You have difficulty breathing.   You have chest pain.   You develop any allergic problems.  Document Released: 01/10/2001 Document Revised: 06/16/2011 Document Reviewed: 08/21/2007 [facility] Patient Information 2012 [place], [place].

## 2011-12-06 NOTE — H&P (View-Only) (Signed)
  NTS SOAP Note  Vital Signs:  Vitals as of: 11/30/2011: Systolic 142: Diastolic 72: Heart Rate 67: Temp 96.9F: Height 5ft 6in: Weight 161Lbs 0 Ounces: OFC 0in: Respiratory Rate 0: O2 Saturation 0: Pain Level 3: BMI 26  BMI : 25.99 kg/m2  Subjective: This 45 Years 6 Months old Female presents for of epigastric pain. Pain worse with food intake.  Pain colicky in nature.  Symptoms have occurred for last 2 years.  Pain does not radiate.  +bloating.  +family history.  + native [place] history. No nausea.  No emesis  NO jaundice.    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 Skin:no rash or prominent lesions Head:Atraumatic; no masses; no abnormalities Eyes:conjunctiva clear, EOM intact, PERRL Mouth:Mucous membranes moist, no mucosal lesions. Throat:no erythema, exudates or lesions. Neck:Supple without lymphadenopathy.  Heart:RRR, no murmur Lungs:CTA bilaterally, no wheezes, rhonchi, rales.  Breathing  unlabored. Abdomen:Soft, ND, no HSM, no masses.mild RUQ tendserness.  No peritoneal signs. Extremities:No deformities, clubbing, cyanosis, or edema.   Assessment:  Diagnosis &amp; Procedure: DiagnosisCode: 574.00, ProcedureCode: 99203,    Plan: Cholelithiasis.  Surgical options discussed with the patient.  Will schedule at patient's convenience.  Patient Education:Alternative treatments to surgery were discussed with patient (and family).Risks and benefits  of procedure were fully explained to the patient (and family) who gave informed consent. Patient/family questions were addressed.  Follow-up:Pending Surgery              

## 2011-12-06 NOTE — Op Note (Signed)
Patient:  [name]  DOB:  Jan 03, 1966  MRN:  [id number]   Preop Diagnosis:  Cholecystitis  Postop Diagnosis:  The same  Procedure:  Laparoscopic cholecystectomy  Surgeon:  Dr. Tilford Pillar  Anes:  General endotracheal, 0.5% Sensorcaine plain for local  Indications:  Patient is a 46 year old female presented my office with a history of abdominal pain. Workup and evaluation was consistent for cholecystitis. Risks benefits alternatives a laparoscopic possible open cholecystectomy were discussed at length with the patient including but not limited to risk of bleeding, infection, bile leak, small bowel injury, intraoperative cardiac and pulmonary events. Patient's questions and concerns are addressed the patient was consented for the planned procedure.  Procedure note:  Patient is taken to the operator is placed in a supine position on the OR table time the general anesthetic is a Optician, dispensing. With the patient was asleep she is endotracheally intubated by the nurse anesthetist. At this point her abdomen is prepped with DuraPrep solution and draped in standard fashion. A stab incision was created supraumbilically with 11 blade scalpel. Additional dissection down to subcuticular tissues carried out using a Coker clamp which he utilized to grasp the anterior normal wall fascia and lift this anteriorly. A Veress needle is inserted. Saline drop test is utilized confirm intraperitoneal placement and then pneumoperitoneum was initiated. Once sufficient pneumoperitoneum was obtained an 11 mm trochars inserted over laparoscopic allowing visualization of the trocar entering into the peritoneal cavity. At this point the inner cannulas removed the laparoscope was reinserted there is no evidence a trocar or Veress needle placement injury. At this time the remaining trochars replaced with a 5 mCi the epigastrium a 5 mCi the midline and a final trocar in the right lateral wall. Patient's placed into a reverse  Trendelenburg left lateral decubitus position. The fundus of the gallbladder was grasped and tented and lifted up and over the right lobe the liver. Due to time nature of the gallbladder I did require drainage of the gallbladder using a Weck needle. The returning aspirate was bilious. This did allow for the fundus of the gallbladder is easily grasped and again lifted up and over the right lobe the liver. At this point but peritoneal dissection was carried out using a [place] to bluntly stripped the peritoneum off the infundibulum. This exposes both the cystic duct and cystic artery entering into the infundibulum. A window was created behind both the structures. 3 endoclips placed proximally one distally and the cystic duct and 2 endoclips placed proximally one distally and the cystic artery and both the structures with despite a between 2 most distal clips. At this point the gallbladder is dissected off the gallbladder fossa using electrocautery. Once free the 10 mm scope is exchanged for a 5 mm scope and the Endo Catch bag was inserted. The gallbladder is placed into an Endo Catch bag and placed up into the right upper quadrant. Inspection of the gallbladder fossa indicate excellent hemostasis having been controlled with electrocautery. The in the clips are noted be in excellent position with no evidence of any bleeding or bile leak. At this time and turned my attention closure.  Using an Endo Close suture passing device a 2-0 Vicryl sutures passed to the umbilical trocar site. With this suture and placed the gallbladder was treated was removed through the umbilical trocar site and intact Endo Catch bag. The gallbladder was sent as a permanent specimen to pathology. At this point the pneumoperitoneum was evacuated the trochars removed. The Vicryl  suture was secured. Local anesthetic is instilled. A 4-0 Monocryl utilized reapproximate the skin edges at all 4 trocar sites. The skin was washed dried moist  dry towel. Benzoin is applied around incision. Half-inch Steri-Strips are placed. The drapes removed the patient was allowed to come out of general anesthetic and stretcher the PACU in stable condition. At the conclusion of procedure all instrument, sponge, needle counts are correct. Patient tolerated procedure extremely well.  Complications:  None apparent  EBL:  Minimal  Specimen:  Gallbladder

## 2011-12-06 NOTE — Anesthesia Procedure Notes (Signed)
Procedure Name: Intubation Date/Time: 12/06/2011 8:02 AM Performed by: Despina Hidden Pre-anesthesia Checklist: Suction available, Emergency Drugs available, Patient identified and Patient being monitored Patient Re-evaluated:Patient Re-evaluated prior to inductionOxygen Delivery Method: Circle System Utilized Preoxygenation: Pre-oxygenation with 100% oxygen Intubation Type: IV induction and Cricoid Pressure applied Ventilation: Mask ventilation without difficulty Laryngoscope Size: 3 and Mac Grade View: Grade I Tube type: Oral Tube size: 7.0 mm Number of attempts: 1 Airway Equipment and Method: stylet Placement Confirmation: ETT inserted through vocal cords under direct vision,  positive ETCO2 and breath sounds checked- equal and bilateral Secured at: 22 cm Tube secured with: Tape Dental Injury: Teeth and Oropharynx as per pre-operative assessment

## 2011-12-06 NOTE — Transfer of Care (Signed)
Immediate Anesthesia Transfer of Care Note  Patient: [name]  Procedure(s) Performed: Procedure(s) (LRB): LAPAROSCOPIC CHOLECYSTECTOMY (N/A)  Patient Location: PACU  Anesthesia Type: General  Level of Consciousness: awake and patient cooperative  Airway & Oxygen Therapy: Patient Spontanous Breathing and Patient connected to face mask oxygen  Post-op Assessment: Report given to PACU RN, Post -op Vital signs reviewed and stable and Patient moving all extremities  Post vital signs: Reviewed and stable  Complications: No apparent anesthesia complications

## 2011-12-06 NOTE — Interval H&P Note (Signed)
History and Physical Interval Note:  12/06/2011 7:47 AM  [name]  has presented today for surgery, with the diagnosis of Calculus of gallbladder with other cholecystitis, without mention of obstruction   The various methods of treatment have been discussed with the patient and family. After consideration of risks, benefits and other options for treatment, the patient has consented to  Procedure(s) (LRB): LAPAROSCOPIC CHOLECYSTECTOMY (N/A) as a surgical intervention .  The patients' history has been reviewed, patient examined, no change in status, stable for surgery.  I have reviewed the patients' chart and labs.  Questions were answered to the patient's satisfaction.     Tajah Schreiner C

## 2011-12-06 NOTE — Anesthesia Postprocedure Evaluation (Signed)
  Anesthesia Post-op Note  Patient: [name]  Procedure(s) Performed: Procedure(s) (LRB): LAPAROSCOPIC CHOLECYSTECTOMY (N/A)  Patient Location: PACU  Anesthesia Type: General  Level of Consciousness: awake, alert , oriented and patient cooperative  Airway and Oxygen Therapy: Patient Spontanous Breathing  Post-op Pain: 2 /10, mild  Post-op Assessment: Post-op Vital signs reviewed, Patient's Cardiovascular Status Stable, Respiratory Function Stable, Patent Airway and No signs of Nausea or vomiting  Post-op Vital Signs: Reviewed and stable  Complications: No apparent anesthesia complications

## 2011-12-10 ENCOUNTER — Encounter (HOSPITAL_COMMUNITY): Payer: Self-pay | Admitting: General Surgery

## 2011-12-22 ENCOUNTER — Ambulatory Visit: Payer: Managed Care, Other (non HMO) | Admitting: Urgent Care

## 2012-01-13 ENCOUNTER — Ambulatory Visit: Payer: Managed Care, Other (non HMO) | Admitting: Urgent Care

## 2012-04-17 ENCOUNTER — Telehealth (HOSPITAL_COMMUNITY): Payer: Self-pay

## 2012-04-17 ENCOUNTER — Other Ambulatory Visit (HOSPITAL_COMMUNITY): Payer: Self-pay | Admitting: Interventional Radiology

## 2012-04-17 DIAGNOSIS — H93A9 Pulsatile tinnitus, unspecified ear: Secondary | ICD-10-CM

## 2012-04-17 NOTE — Telephone Encounter (Signed)
i spoke with [name] and scheduled her f/u angio with Dr. Corliss Skains

## 2012-05-03 ENCOUNTER — Other Ambulatory Visit: Payer: Self-pay | Admitting: Radiology

## 2012-05-09 ENCOUNTER — Encounter (HOSPITAL_COMMUNITY): Payer: Self-pay | Admitting: Pharmacy Technician

## 2012-05-09 ENCOUNTER — Other Ambulatory Visit: Payer: Self-pay | Admitting: Radiology

## 2012-05-12 ENCOUNTER — Ambulatory Visit (HOSPITAL_COMMUNITY)
Admission: RE | Admit: 2012-05-12 | Discharge: 2012-05-12 | Disposition: A | Discharge status: Home / Self Care | Payer: Managed Care, Other (non HMO) | Source: Ambulatory Visit | Attending: Interventional Radiology | Admitting: Interventional Radiology

## 2012-05-12 ENCOUNTER — Encounter (HOSPITAL_COMMUNITY): Payer: Self-pay

## 2012-05-12 ENCOUNTER — Other Ambulatory Visit (HOSPITAL_COMMUNITY): Payer: Self-pay | Admitting: Interventional Radiology

## 2012-05-12 DIAGNOSIS — Z09 Encounter for follow-up examination after completed treatment for conditions other than malignant neoplasm: Secondary | ICD-10-CM | POA: Insufficient documentation

## 2012-05-12 DIAGNOSIS — G43909 Migraine, unspecified, not intractable, without status migrainosus: Secondary | ICD-10-CM | POA: Insufficient documentation

## 2012-05-12 DIAGNOSIS — H93A9 Pulsatile tinnitus, unspecified ear: Secondary | ICD-10-CM

## 2012-05-12 DIAGNOSIS — I671 Cerebral aneurysm, nonruptured: Secondary | ICD-10-CM | POA: Insufficient documentation

## 2012-05-12 DIAGNOSIS — H409 Unspecified glaucoma: Secondary | ICD-10-CM | POA: Insufficient documentation

## 2012-05-12 DIAGNOSIS — K219 Gastro-esophageal reflux disease without esophagitis: Secondary | ICD-10-CM | POA: Insufficient documentation

## 2012-05-12 DIAGNOSIS — H9319 Tinnitus, unspecified ear: Secondary | ICD-10-CM | POA: Insufficient documentation

## 2012-05-12 LAB — CBC
HCT: 40 % (ref 36.0–46.0)
MCV: 87.1 fL (ref 78.0–100.0)
RDW: 13.5 % (ref 11.5–15.5)
WBC: 5.7 10*3/uL (ref 4.0–10.5)

## 2012-05-12 LAB — DIFFERENTIAL
Basophils Absolute: 0 10*3/uL (ref 0.0–0.1)
Eosinophils Relative: 4 % (ref 0–5)
Lymphocytes Relative: 24 % (ref 12–46)
Monocytes Absolute: 0.5 10*3/uL (ref 0.1–1.0)
Monocytes Relative: 9 % (ref 3–12)

## 2012-05-12 LAB — BASIC METABOLIC PANEL
BUN: 9 mg/dL (ref 6–23)
CO2: 29 mEq/L (ref 19–32)
Chloride: 105 mEq/L (ref 96–112)
Creatinine, Ser: 0.65 mg/dL (ref 0.50–1.10)

## 2012-05-12 LAB — APTT: aPTT: 30 seconds (ref 24–37)

## 2012-05-12 MED ORDER — HYDRALAZINE HCL 20 MG/ML IJ SOLN
INTRAMUSCULAR | Status: AC
  Filled 2012-05-12: qty 1

## 2012-05-12 MED ORDER — ACETAMINOPHEN 325 MG PO TABS
650.0000 mg | ORAL_TABLET | Freq: Once | ORAL | Status: AC
  Administered 2012-05-12: 650 mg via ORAL

## 2012-05-12 MED ORDER — ONDANSETRON HCL 4 MG/2ML IJ SOLN
INTRAMUSCULAR | Status: DC | PRN
  Administered 2012-05-12: 4 mg via INTRAVENOUS

## 2012-05-12 MED ORDER — MIDAZOLAM HCL 2 MG/2ML IJ SOLN
INTRAMUSCULAR | Status: AC
  Filled 2012-05-12: qty 4

## 2012-05-12 MED ORDER — FENTANYL CITRATE 0.05 MG/ML IJ SOLN
INTRAMUSCULAR | Status: AC
  Filled 2012-05-12: qty 4

## 2012-05-12 MED ORDER — HYDRALAZINE HCL 20 MG/ML IJ SOLN
INTRAMUSCULAR | Status: DC | PRN
  Administered 2012-05-12: 5 mg via INTRAVENOUS

## 2012-05-12 MED ORDER — OXYCODONE-ACETAMINOPHEN 5-325 MG PO TABS
1.0000 | ORAL_TABLET | Freq: Once | ORAL | Status: AC
  Administered 2012-05-12: 1 via ORAL
  Filled 2012-05-12: qty 1

## 2012-05-12 MED ORDER — IOHEXOL 300 MG/ML  SOLN
150.0000 mL | Freq: Once | INTRAMUSCULAR | Status: AC | PRN
  Administered 2012-05-12: 65 mL via INTRA_ARTERIAL

## 2012-05-12 MED ORDER — ONDANSETRON HCL 4 MG/2ML IJ SOLN
INTRAMUSCULAR | Status: AC
  Filled 2012-05-12: qty 2

## 2012-05-12 MED ORDER — MIDAZOLAM HCL 5 MG/5ML IJ SOLN
INTRAMUSCULAR | Status: DC | PRN
  Administered 2012-05-12: 0.5 mg via INTRAVENOUS
  Administered 2012-05-12: 1 mg via INTRAVENOUS

## 2012-05-12 MED ORDER — PROMETHAZINE HCL 25 MG PO TABS
25.0000 mg | ORAL_TABLET | Freq: Once | ORAL | Status: AC
  Administered 2012-05-12: 25 mg via ORAL
  Filled 2012-05-12: qty 1

## 2012-05-12 MED ORDER — SODIUM CHLORIDE 0.9 % IV SOLN: INTRAVENOUS | Status: AC

## 2012-05-12 MED ORDER — FENTANYL CITRATE 0.05 MG/ML IJ SOLN
INTRAMUSCULAR | Status: DC | PRN
  Administered 2012-05-12: 25 ug via INTRAVENOUS
  Administered 2012-05-12: 12.5 ug via INTRAVENOUS
  Administered 2012-05-12: 25 ug via INTRAVENOUS

## 2012-05-12 MED ORDER — SODIUM CHLORIDE 0.9 % IV SOLN: Freq: Once | INTRAVENOUS | Status: DC

## 2012-05-12 MED ORDER — HEPARIN SODIUM (PORCINE) 1000 UNIT/ML IJ SOLN
INTRAMUSCULAR | Status: DC | PRN
  Administered 2012-05-12 (×2): 500 [IU] via INTRAVENOUS

## 2012-05-12 NOTE — ED Notes (Signed)
Zofran IV given PRN nausea

## 2012-05-12 NOTE — Procedures (Signed)
S/P 4 vessel cerebral arteriogram  RT CFA approach  Prelimary findings  1. Occluded LT ICA sup hypophyseal aneurysm.

## 2012-05-12 NOTE — Progress Notes (Signed)
Patient ID: [name], female   DOB: [birth year], 46 y.o.   MRN: [id number]   Cerebral arteriogram was performed this am per Rt fem artery  Pt states rt leg feels heavy; not painful Asked to examine pt Pt also has h/a  PE: VSS; afeb Rt groin NT; no bleeding; no hematoma Rt foot pulses 2+ Skin is warm and dry Good strength Bilat legs (push/pull = Bilat) Sensation good ; but better upper rt leg than lower No sign of swelling or redness of extremity  Will report to Dr Corliss Skains Tylenol 2 po now Reassurance Will have RN re check leg and call me before dc

## 2012-05-12 NOTE — H&P (Signed)
[name] is an 46 y.o. female.   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 No sxs. Still has headache with menses; lt ear tinnitus apparent while has headache scheduled now for re check cerebral arteriogram HPI: glaucoma; GERD; Migraines  Past Medical History  Diagnosis Date  . Glaucoma   . Chronic constipation   . GERD (gastroesophageal reflux disease)   . Hemorrhoids, internal, with bleeding DEC 2010 TCS  . Migraines   . Aneurysm 2009 COILING    stent placed  . Abdominal pain OCT 2011 NL CBC, HFP, LIPASE    MRCP-SEPTATED GB, NL CBD, R LOBE HEMANGIOMA  . Dysrhythmia     "irregular heartbeat"  . PONV (postoperative nausea and vomiting)     Past Surgical History  Procedure Date  . Glaucoma surgery MAR 2012  . Colonoscopy     DEC 2010 IH  . Tubal ligation   . Upper gastrointestinal endoscopy     OCT 2011 RING DILATED TO , CANDIDA ESOPHAGITIS, CHRONIC GASTRITIS  . Anuerysm repair     coil  . Esophagogastroduodenoscopy 09/17/2011    Procedure: ESOPHAGOGASTRODUODENOSCOPY (EGD);  Surgeon: Arlyce Harman, MD;  Location: [facility];  Service: Endoscopy;  Laterality: N/A;  1:10  . Appendectomy   . Eye surgery     glaucoma-unsure if both or just one of them  . Cholecystectomy 12/06/2011    Procedure: LAPAROSCOPIC CHOLECYSTECTOMY;  Surgeon: Fabio Bering, MD;  Location: [facility];  Service: General;  Laterality: N/A;    Family History  Problem Relation Age of Onset  . Anesthesia problems Neg Hx   . Hypotension Neg Hx   . Malignant hyperthermia Neg Hx   . Pseudochol deficiency Neg Hx    Social History:  reports that she has never smoked. She has never used smokeless tobacco. She reports that she does not drink alcohol or use illicit drugs.  Allergies:  Allergies  Allergen Reactions  . Codeine Nausea And Vomiting  . Adhesive (Tape) Rash     (Not in a hospital admission)  Results for orders placed during the  hospital encounter of 05/12/12 (from the past 48 hour(s))  APTT     Status: Normal   Collection Time   05/12/12  6:55 AM      Component Value Range Comment   aPTT 30  24 - 37 seconds   BASIC METABOLIC PANEL     Status: Abnormal   Collection Time   05/12/12  6:55 AM      Component Value Range Comment   Sodium 143  135 - 145 mEq/L    Potassium 3.8  3.5 - 5.1 mEq/L    Chloride 105  96 - 112 mEq/L    CO2 29  19 - 32 mEq/L    Glucose, Bld 109 (*) 70 - 99 mg/dL    BUN 9  6 - 23 mg/dL    Creatinine, Ser 1.61  0.50 - 1.10 mg/dL    Calcium 9.1  8.4 - 09.6 mg/dL    GFR calc non Af Amer >90  >90 mL/min    GFR calc Af Amer >90  >90 mL/min   CBC     Status: Normal   Collection Time   05/12/12  6:55 AM      Component Value Range Comment   WBC 5.7  4.0 - 10.5 K/uL    RBC 4.59  3.87 - 5.11 MIL/uL    Hemoglobin 13.6  12.0 -  15.0 g/dL    HCT 69.6  29.5 - 28.4 %    MCV 87.1  78.0 - 100.0 fL    MCH 29.6  26.0 - 34.0 pg    MCHC 34.0  30.0 - 36.0 g/dL    RDW 13.2  44.0 - 10.2 %    Platelets 245  150 - 400 K/uL   DIFFERENTIAL     Status: Normal   Collection Time   05/12/12  6:55 AM      Component Value Range Comment   Neutrophils Relative 63  43 - 77 %    Neutro Abs 3.6  1.7 - 7.7 K/uL    Lymphocytes Relative 24  12 - 46 %    Lymphs Abs 1.4  0.7 - 4.0 K/uL    Monocytes Relative 9  3 - 12 %    Monocytes Absolute 0.5  0.1 - 1.0 K/uL    Eosinophils Relative 4  0 - 5 %    Eosinophils Absolute 0.2  0.0 - 0.7 K/uL    Basophils Relative 0  0 - 1 %    Basophils Absolute 0.0  0.0 - 0.1 K/uL   PROTIME-INR     Status: Normal   Collection Time   05/12/12  6:55 AM      Component Value Range Comment   Prothrombin Time 13.7  11.6 - 15.2 seconds    INR 1.03  0.00 - 1.49    No results found.  Review of Systems  Constitutional: Negative for fever.  HENT: Positive for tinnitus.        Lt ear tinnitus - mostly with a headache  Respiratory: Negative for shortness of breath.   Cardiovascular: Negative for  chest pain.  Gastrointestinal: Negative for nausea and vomiting.  Neurological: Positive for headaches. Negative for dizziness and focal weakness.    Blood pressure 147/75, pulse 48, temperature 96.9 F (36.1 C), temperature source Oral, resp. rate 18, height 5\' 6"  (1.676 m), weight 165 lb (74.844 kg), last menstrual period 05/12/2012, SpO2 97.00%. Physical Exam  Constitutional: She is oriented to person, place, and time. She appears well-developed and well-nourished.  Cardiovascular: Normal rate, regular rhythm and normal heart sounds.   Respiratory: Effort normal and breath sounds normal. She has no wheezes.  GI: Soft. Bowel sounds are normal. There is no tenderness.  Musculoskeletal: Normal range of motion.  Neurological: She is alert and oriented to person, place, and time.  Skin: Skin is warm and dry.  Psychiatric: She has a normal mood and affect. Her behavior is normal. Judgment and thought content normal.     Assessment/Plan  L ICA aneurysm stent/coil 11/2007 Has done well; still has lt eat tinnitus with headaches - mostly with menses Scheduled for re check cerebral arteriogram Pt aware of procedure benefits and risks and agreeable to proceed. Consent signed  Journei Armstrong A 05/12/2012, 8:00 AM

## 2012-05-12 NOTE — ED Notes (Addendum)
02 off- SAT 100% rair

## 2012-05-12 NOTE — Discharge Instructions (Signed)
Groin Site Care Refer to this sheet in the next few weeks. These instructions provide you with information on caring for yourself after your procedure. Your caregiver may also give you more specific instructions. Your treatment has been planned according to current medical practices, but problems sometimes occur. Call your caregiver if you have any problems or questions after your procedure. HOME CARE INSTRUCTIONS  You may shower 24 hours after the procedure. Remove the bandage (dressing) and gently wash the site with plain soap and water. Gently pat the site dry.   Do not apply powder or lotion to the site.   Do not sit in a bathtub, swimming pool, or whirlpool for 5 to 7 days.   No bending, squatting, or lifting anything over 10 pounds (4.5 kg) as directed by your caregiver.   Inspect the site at least twice daily.   Do not drive home if you are discharged the same day of the procedure. Have someone else drive you.   You may drive 24 hours after the procedure unless otherwise instructed by your caregiver.  What to expect:  Any bruising will usually fade within 1 to 2 weeks.   Blood that collects in the tissue (hematoma) may be painful to the touch. It should usually decrease in size and tenderness within 1 to 2 weeks.  SEEK IMMEDIATE MEDICAL CARE IF:  You have unusual pain at the groin site or down the affected leg.   You have redness, warmth, swelling, or pain at the groin site.   You have drainage (other than a small amount of blood on the dressing).   You have chills.   You have a fever or persistent symptoms for more than 72 hours.   You have a fever and your symptoms suddenly get worse.   Your leg becomes pale, cool, tingly, or numb.   You have heavy bleeding from the site. Hold pressure on the site.  Document Released: 11/06/2010 Document Revised: 09/23/2011 Document Reviewed: 11/06/2010 [employer] Patient Information 2012 [employer]. 

## 2012-05-12 NOTE — Progress Notes (Signed)
CLIENT STATES NO CHANGE IN RIGHT LEG FEELING HEAVY; C/O 12/10 HEADACHE AND PAM TURPIN,PA NOTIFIED AND ORDERS NOTED

## 2012-05-12 NOTE — Progress Notes (Signed)
C/O RIGHT LEG FEELING HEAVY AND PAM TURPIN,PA NOTIFIED AND ORDER NOTED FOR TYLENOL AND PAM IN TO SEE CLIENT AND CHECKED HER LEG

## 2012-05-12 NOTE — ED Notes (Signed)
FAMILY TALKING WITH MD

## 2012-05-12 NOTE — Progress Notes (Signed)
CONT C/O 12/10 HEADACHE AND PA NOTIFIED AND ORDER NOTED AND PER PAM TURPIN,PA MAY REPEAT PERCOCET AND CLIENT MAY BE DISCHARGED HOME NOW

## 2012-05-12 NOTE — Progress Notes (Signed)
Recheck at 3:45pm. Pt states leg feels better. Migraine headache easing off some after second Percocet. She would like to go home to take her normal meds and rest comfortably at home. No neuro deficits noted. Rt groin without hematoma. Feet warm with excellent pulse. OK fro Discharge home.  Brayton El PA-C 05/12/2012 3:54 PM

## 2012-05-12 NOTE — ED Notes (Signed)
Apresoline 5mg  IV given for increased BP

## 2012-07-26 ENCOUNTER — Other Ambulatory Visit: Payer: Self-pay | Admitting: Obstetrics and Gynecology

## 2012-07-26 DIAGNOSIS — Z1231 Encounter for screening mammogram for malignant neoplasm of breast: Secondary | ICD-10-CM

## 2012-09-04 ENCOUNTER — Ambulatory Visit
Admission: RE | Admit: 2012-09-04 | Discharge: 2012-09-04 | Disposition: A | Discharge status: Home / Self Care | Payer: Managed Care, Other (non HMO) | Source: Ambulatory Visit | Attending: Obstetrics and Gynecology | Admitting: Obstetrics and Gynecology

## 2012-09-04 DIAGNOSIS — Z1231 Encounter for screening mammogram for malignant neoplasm of breast: Secondary | ICD-10-CM

## 2012-10-25 ENCOUNTER — Other Ambulatory Visit: Payer: Self-pay | Admitting: Gastroenterology

## 2012-10-26 NOTE — Telephone Encounter (Signed)
Pt needs OV prior to further RFs-gave #1 month supply Please sched

## 2012-10-27 ENCOUNTER — Encounter: Payer: Self-pay | Admitting: Urgent Care

## 2012-10-27 NOTE — Telephone Encounter (Signed)
Pt is aware of OV on 11/20/12 at 130 with KJ and appt card was mailed

## 2012-11-20 ENCOUNTER — Ambulatory Visit: Payer: Managed Care, Other (non HMO) | Admitting: Urgent Care

## 2012-12-26 ENCOUNTER — Other Ambulatory Visit: Payer: Self-pay

## 2012-12-28 MED ORDER — OMEPRAZOLE 20 MG PO CPDR: DELAYED_RELEASE_CAPSULE | ORAL | Status: DC

## 2012-12-28 NOTE — Telephone Encounter (Signed)
Left a message on home and cell phones for her to call us back to set up appt

## 2012-12-28 NOTE — Telephone Encounter (Signed)
Needs OV for further refills. I have refilled X 1.

## 2012-12-28 NOTE — Telephone Encounter (Signed)
Dawn can you please make her an OV.

## 2013-03-30 ENCOUNTER — Ambulatory Visit (INDEPENDENT_AMBULATORY_CARE_PROVIDER_SITE_OTHER): Payer: Managed Care, Other (non HMO) | Admitting: Family Medicine

## 2013-03-30 VITALS — BP 170/90 | HR 70 | Temp 98.5°F | Resp 18 | Ht 66.5 in | Wt 162.0 lb

## 2013-03-30 DIAGNOSIS — R51 Headache: Secondary | ICD-10-CM

## 2013-03-30 DIAGNOSIS — K0889 Other specified disorders of teeth and supporting structures: Secondary | ICD-10-CM

## 2013-03-30 DIAGNOSIS — K089 Disorder of teeth and supporting structures, unspecified: Secondary | ICD-10-CM

## 2013-03-30 LAB — POCT CBC
Hemoglobin: 13.6 g/dL (ref 12.2–16.2)
Lymph, poc: 1.4 (ref 0.6–3.4)
MCHC: 32 g/dL (ref 31.8–35.4)
MID (cbc): 0.5 (ref 0–0.9)
MPV: 9 fL (ref 0–99.8)
POC Granulocyte: 5.6 (ref 2–6.9)
POC LYMPH PERCENT: 18.2 %L (ref 10–50)
POC MID %: 6.6 %M (ref 0–12)
Platelet Count, POC: 230 10*3/uL (ref 142–424)
RDW, POC: 13.7 %

## 2013-03-30 MED ORDER — AMOXICILLIN 500 MG PO CAPS: 1000.0000 mg | ORAL_CAPSULE | Freq: Two times a day (BID) | ORAL | Status: DC

## 2013-03-30 MED ORDER — FLUCONAZOLE 150 MG PO TABS: 150.0000 mg | ORAL_TABLET | Freq: Once | ORAL | Status: DC

## 2013-03-30 MED ORDER — HYDROCODONE-ACETAMINOPHEN 5-325 MG PO TABS: 1.0000 | ORAL_TABLET | Freq: Four times a day (QID) | ORAL | Status: DC | PRN

## 2013-03-30 NOTE — Patient Instructions (Addendum)
1.  Call dentist on Monday for appointment.  2 .  Return for worsening pain or development of rash.

## 2013-03-30 NOTE — Progress Notes (Signed)
[address]   [place], [place]  [address]   [phone]  Subjective:    Patient ID: [name], female    DOB: [birth year], 47 y.o.   MRN: [id number]  HPI This 47 y.o. female presents for evaluation of dental pain, URI.  Onset five days ago but has worsened today; no constant.  Pain has been moving to bottom and top of teeth.  Had a headache all week.  Then had a fever blister on lower lip.  Teeth are painful.  Pain with eating.  No pain with brushing teeth but really careful.  Sore in jaw area L.  No ear pain.  Constant.  Will get sharp shooting pains with chewing.  Bottom teeth were really painful; applied orajel with improvement.  Took 4 Motrin this morning and 4 at 12:00; mild relief.  No numbness or tingling.  No weakness in face.  No vision changes.  Beginning of week, had mild nasal congestion; took Flonase with improvement.  Clear drainage.  No fever; +chills/sweats.  No facial swelling.  No sore throat; no ear pain.  No rash.  Frontal headache is severe.  Did not go to the dentist this week.  Pain really got worse last night.  Chewing makes pain worse.  Has not noticed that laughing, smiling makes pain worse.  History of migraines; this headache is not a migraine; no nausea, photophobia, phonophobia.  Headache 8/10.  Teeth pain/facial pain 10/10; teeth pain moving so a bit confused.  PCP:  Ouida Sills in [place].   Review of Systems  Constitutional: Positive for chills. Negative for fever.  HENT: Positive for congestion and dental problem. Negative for hearing loss, ear pain, sore throat, facial swelling, rhinorrhea, sneezing, drooling, mouth sores, trouble swallowing, neck pain, neck stiffness, voice change, postnasal drip and ear discharge.   Respiratory: Negative for cough.     Past Medical History  Diagnosis Date  . Glaucoma   . Chronic constipation   . GERD (gastroesophageal reflux disease)   . Hemorrhoids, internal, with bleeding DEC 2010 TCS  . Migraines   . Aneurysm  2009 COILING    stent placed  . Abdominal pain OCT 2011 NL CBC, HFP, LIPASE    MRCP-SEPTATED GB, NL CBD, R LOBE HEMANGIOMA  . Dysrhythmia     "irregular heartbeat"  . PONV (postoperative nausea and vomiting)     Past Surgical History  Procedure Laterality Date  . Glaucoma surgery  MAR 2012  . Colonoscopy      DEC 2010 IH  . Tubal ligation    . Upper gastrointestinal endoscopy      OCT 2011 RING DILATED TO , CANDIDA ESOPHAGITIS, CHRONIC GASTRITIS  . Anuerysm repair      coil  . Esophagogastroduodenoscopy  09/17/2011    Procedure: ESOPHAGOGASTRODUODENOSCOPY (EGD);  Surgeon: Arlyce Harman, MD;  Location: [facility];  Service: Endoscopy;  Laterality: N/A;  1:10  . Appendectomy    . Eye surgery      glaucoma-unsure if both or just one of them  . Cholecystectomy  12/06/2011    Procedure: LAPAROSCOPIC CHOLECYSTECTOMY;  Surgeon: Fabio Bering, MD;  Location: [facility];  Service: General;  Laterality: N/A;    Prior to Admission medications   Medication Sig Start Date End Date Taking? Authorizing Provider  amitriptyline (ELAVIL) 25 MG tablet Take 75 mg by mouth at bedtime.    Yes Historical Provider, MD  aspirin EC 81 MG tablet Take 81 mg by mouth every other day.  Yes Historical Provider, MD  brimonidine (ALPHAGAN) 0.2 % ophthalmic solution Place 1 drop into the left eye 3 (three) times daily.    Yes Historical Provider, MD  dorzolamide-timolol (COSOPT) 22.3-6.8 MG/ML ophthalmic solution Place 1 drop into the left eye 2 (two) times daily.     Yes Historical Provider, MD  omeprazole (PRILOSEC) 20 MG capsule TAKE ONE CAPSULE BY MOUTH TWICE DAILY 30  MINUTES PRIOR TO MEALS 12/28/12  Yes Nira Retort, NP  polyethylene glycol (MIRALAX / GLYCOLAX) packet Take 17 g by mouth daily.   Yes Historical Provider, MD  promethazine (PHENERGAN) 25 MG tablet Take 25 mg by mouth every 6 (six) hours as needed. For nausea   Yes Historical Provider, MD  nabumetone (RELAFEN) 750 MG tablet Take 750 mg by  mouth 2 (two) times daily as needed. For migraine    Historical Provider, MD  naratriptan (AMERGE) 2.5 MG tablet Take 2.5 mg by mouth as needed. Take one (1) tablet at onset of headache; if returns or does not resolve, may repeat after 4 hours; do not exceed five (5) mg in 24 hours.    Historical Provider, MD    Allergies  Allergen Reactions  . Codeine Nausea And Vomiting  . Adhesive (Tape) Rash    History   Social History  . Marital Status: Married    Spouse Name: N/A    Number of Children: 1  . 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 Smoking status: Never Smoker   . Smokeless tobacco: Never Used  . Alcohol Use: No  . Drug Use: No  . Sexually Active: Yes -- Female partner(s)    Birth Control/ Protection: Surgical     Comment: spouse   Other Topics Concern  . Not on file   Social History Narrative   Married.  Works at [employer].  One daughter born 110    Family History  Problem Relation Age of Onset  . Anesthesia problems Neg Hx   . Hypotension Neg Hx   . Malignant hyperthermia Neg Hx   . Pseudochol deficiency Neg Hx   . Heart disease Mother   . Diabetes Maternal Grandmother   . Stroke Paternal Grandmother   . Heart disease Paternal Grandfather        Objective:   Physical Exam  Nursing note and vitals reviewed. Constitutional: She is oriented to person, place, and time. She appears well-developed and well-nourished.  HENT:  Head: Normocephalic and atraumatic.  Right Ear: External ear normal.  Left Ear: External ear normal.  Nose: Nose normal. Right sinus exhibits no maxillary sinus tenderness and no frontal sinus tenderness. Left sinus exhibits no maxillary sinus tenderness and no frontal sinus tenderness.  Mouth/Throat: Oropharynx is clear and moist. No oral lesions.  +TTP upper teeth on L; +TTP lower L teeth and gums.  Neurological: She is alert and oriented to person, place, and time. No cranial nerve  deficit. She exhibits normal muscle tone. Coordination normal.  Skin:  Single vesicular lesion midline lower lip.  Psychiatric: She has a normal mood and affect. Her behavior is normal. Judgment and thought content normal.   Results for orders placed in visit on 03/30/13  POCT CBC      Result Value Range   WBC 7.5  4.6 - 10.2 K/uL   Lymph, poc 1.4  0.6 - 3.4   POC LYMPH PERCENT 18.2  10 - 50 %L   MID (  cbc) 0.5  0 - 0.9   POC MID % 6.6  0 - 12 %M   POC Granulocyte 5.6  2 - 6.9   Granulocyte percent 75.2  37 - 80 %G   RBC 4.50  4.04 - 5.48 M/uL   Hemoglobin 13.6  12.2 - 16.2 g/dL   HCT, POC 16.1  09.6 - 47.9 %   MCV 94.4  80 - 97 fL   MCH, POC 30.2  27 - 31.2 pg   MCHC 32.0  31.8 - 35.4 g/dL   RDW, POC 04.5     Platelet Count, POC 230  142 - 424 K/uL   MPV 9.0  0 - 99.8 fL  POCT SEDIMENTATION RATE      Result Value Range   POCT SED RATE 14  0 - 22 mm/hr       Assessment & Plan:  Headache(784.0)  Tooth pain - Plan: POCT CBC, POCT SEDIMENTATION RATE   1. L dental and facial pain:  New.  Ddx includes primary dental process, early Zoster, sinusitis, trigeminal neuralgia, parotid stone.  Exam consistent with dental process/early infection.  Treat with Amoxicillin, hydrocodone. Continue Ibuprofen 800mg  tid.  Call dentist on Monday for appointment.  Return for development of rash.   2.  Headache:  New.  Normal neurological exam.  Secondary to dental pathology.  RTC for acute worsening.  Meds ordered this encounter  Medications  . amoxicillin (AMOXIL) 500 MG capsule    Sig: Take 2 capsules (1,000 mg total) by mouth 2 (two) times daily.    Dispense:  40 capsule    Refill:  0  . fluconazole (DIFLUCAN) 150 MG tablet    Sig: Take 1 tablet (150 mg total) by mouth once. Repeat if needed    Dispense:  2 tablet    Refill:  0  . HYDROcodone-acetaminophen (NORCO/VICODIN) 5-325 MG per tablet    Sig: Take 1 tablet by mouth every 6 (six) hours as needed for pain.    Dispense:  40 tablet      Refill:  0

## 2013-04-30 ENCOUNTER — Telehealth (HOSPITAL_COMMUNITY): Payer: Self-pay | Admitting: Interventional Radiology

## 2013-04-30 NOTE — Telephone Encounter (Signed)
Called pt left VM for her to call and set up consult appt per Dr. Wilmon Arms

## 2013-05-01 ENCOUNTER — Other Ambulatory Visit (HOSPITAL_COMMUNITY): Payer: Self-pay | Admitting: Interventional Radiology

## 2013-05-01 DIAGNOSIS — R519 Headache, unspecified: Secondary | ICD-10-CM

## 2013-05-01 DIAGNOSIS — H9319 Tinnitus, unspecified ear: Secondary | ICD-10-CM

## 2013-05-01 DIAGNOSIS — I729 Aneurysm of unspecified site: Secondary | ICD-10-CM

## 2013-05-24 ENCOUNTER — Ambulatory Visit (HOSPITAL_COMMUNITY)
Admission: RE | Admit: 2013-05-24 | Discharge: 2013-05-24 | Disposition: A | Discharge status: Home / Self Care | Payer: Managed Care, Other (non HMO) | Source: Ambulatory Visit | Attending: Interventional Radiology | Admitting: Interventional Radiology

## 2013-05-24 DIAGNOSIS — I729 Aneurysm of unspecified site: Secondary | ICD-10-CM

## 2013-05-24 DIAGNOSIS — H9319 Tinnitus, unspecified ear: Secondary | ICD-10-CM

## 2013-05-24 DIAGNOSIS — R519 Headache, unspecified: Secondary | ICD-10-CM

## 2013-11-06 ENCOUNTER — Ambulatory Visit (INDEPENDENT_AMBULATORY_CARE_PROVIDER_SITE_OTHER): Payer: BC Managed Care – PPO | Admitting: Emergency Medicine

## 2013-11-06 VITALS — BP 130/80 | HR 71 | Temp 98.3°F | Resp 18 | Ht 66.5 in | Wt 160.0 lb

## 2013-11-06 DIAGNOSIS — K2970 Gastritis, unspecified, without bleeding: Secondary | ICD-10-CM

## 2013-11-06 DIAGNOSIS — K219 Gastro-esophageal reflux disease without esophagitis: Secondary | ICD-10-CM

## 2013-11-06 DIAGNOSIS — K2990 Gastroduodenitis, unspecified, without bleeding: Principal | ICD-10-CM

## 2013-11-06 LAB — POCT CBC
GRANULOCYTE PERCENT: 71.4 % (ref 37–80)
HCT, POC: 44.5 % (ref 37.7–47.9)
Hemoglobin: 14.3 g/dL (ref 12.2–16.2)
Lymph, poc: 1.2 (ref 0.6–3.4)
MCH, POC: 30.6 pg (ref 27–31.2)
MCHC: 32.1 g/dL (ref 31.8–35.4)
MCV: 95.3 fL (ref 80–97)
MID (CBC): 0.4 (ref 0–0.9)
MPV: 7.9 fL (ref 0–99.8)
PLATELET COUNT, POC: 262 10*3/uL (ref 142–424)
POC GRANULOCYTE: 4 (ref 2–6.9)
POC LYMPH %: 20.6 % (ref 10–50)
POC MID %: 8 % (ref 0–12)
RBC: 4.67 M/uL (ref 4.04–5.48)
RDW, POC: 13.8 %
WBC: 5.6 10*3/uL (ref 4.6–10.2)

## 2013-11-06 MED ORDER — LANSOPRAZOLE 30 MG PO CPDR: 30.0000 mg | DELAYED_RELEASE_CAPSULE | Freq: Every day | ORAL | Status: DC

## 2013-11-06 MED ORDER — SUCRALFATE 1 G PO TABS: ORAL_TABLET | ORAL | Status: DC

## 2013-11-06 NOTE — Addendum Note (Signed)
Addended by: Kem Boroughs D on: 11/06/2013 12:39 PM   Modules accepted: Orders

## 2013-11-06 NOTE — Progress Notes (Signed)
[facility] [address], [place] [address] [phone]  Date:  11/06/2013   Name:  [name]   DOB:  11/30/1965   MRN:  [id number]  PCP:  Asencion Noble, MD    Chief Complaint: Abdominal Pain   History of Present Illness:  [name] is a 48 y.o. very pleasant female patient who presents with the following:  Has pain in epigastrium since Saturday.  Not improved by prilosec.   Has increased reflux and waterbrash over past two weeks. No fever or chills.  No nausea or vomiting. No food intolerance.  No alcohol or excess caffeine. Nonsmoker.  Gallbladder out.  No vomiting blood.  The patient has no complaint of blood, mucous, or pus in her stools. Less appetite than usual.  No improvement with over the counter medications or other home remedies. Denies other complaint or health concern today.   Patient Active Problem List   Diagnosis Date Noted  . Migraine 11/18/2011  . Dysphagia 01/28/2011  . GERD (gastroesophageal reflux disease) 01/28/2011  . Hemangioma of liver 07/24/2010  . ABDOMINAL PAIN 07/24/2010  . NONSPECIFIC ABN FINDNG RAD&OTH EXAM BILARY TRCT 07/24/2010  . HEMORRHOIDS 11/19/2009  . CONSTIPATION 09/26/2009    Past Medical History  Diagnosis Date  . Glaucoma   . Chronic constipation   . GERD (gastroesophageal reflux disease)   . Hemorrhoids, internal, with bleeding DEC 2010 TCS  . Migraines   . Aneurysm 2009 COILING    stent placed  . Abdominal pain OCT 2011 NL CBC, HFP, LIPASE    MRCP-SEPTATED GB, NL CBD, R LOBE HEMANGIOMA  . Dysrhythmia     "irregular heartbeat"  . PONV (postoperative nausea and vomiting)     Past Surgical History  Procedure Laterality Date  . Glaucoma surgery  MAR 2012  . Colonoscopy      DEC 2010 IH  . Tubal ligation    . Upper gastrointestinal endoscopy      OCT 2011 RING DILATED TO 16MM, CANDIDA ESOPHAGITIS, CHRONIC GASTRITIS  . Anuerysm repair      coil  . Esophagogastroduodenoscopy  09/17/2011    Procedure: ESOPHAGOGASTRODUODENOSCOPY (EGD);  Surgeon: Dorothyann Peng, MD;  Location: [facility];  Service: Endoscopy;  Laterality: N/A;  1:10  . Appendectomy    . Eye surgery      glaucoma-unsure if both or just one of them  . Cholecystectomy  12/06/2011    Procedure: LAPAROSCOPIC CHOLECYSTECTOMY;  Surgeon: Donato Heinz, MD;  Location: [facility];  Service: General;  Laterality: N/A;    History  Substance Use Topics  . Smoking status: Never Smoker   . Smokeless tobacco: Never Used  . Alcohol Use: No    Family History  Problem Relation Age of Onset  . Anesthesia problems Neg Hx   . Hypotension Neg Hx   . Malignant hyperthermia Neg Hx   . Pseudochol deficiency Neg Hx   . Heart disease Mother   . Diabetes Maternal Grandmother   . Stroke Paternal Grandmother   . Heart disease Paternal Grandfather     Allergies  Allergen Reactions  . Augmentin [Amoxicillin-Pot Clavulanate] Diarrhea and Nausea And Vomiting  . Codeine Nausea And Vomiting  . Adhesive [Tape] Rash    Medication list has been reviewed and updated.  Current Outpatient Prescriptions on File Prior to Visit  Medication Sig Dispense Refill  . amitriptyline (ELAVIL) 25 MG tablet Take 75 mg by mouth at bedtime.       Marland Kitchen  aspirin EC 81 MG tablet Take 81 mg by mouth every other day.      . brimonidine (ALPHAGAN) 0.2 % ophthalmic solution Place 1 drop into the left eye 3 (three) times daily.       . dorzolamide-timolol (COSOPT) 22.3-6.8 MG/ML ophthalmic solution Place 1 drop into the left eye 2 (two) times daily.        Marland Kitchen omeprazole (PRILOSEC) 20 MG capsule TAKE ONE CAPSULE BY MOUTH TWICE DAILY 30  MINUTES PRIOR TO MEALS  62 capsule  0  . polyethylene glycol (MIRALAX / GLYCOLAX) packet Take 17 g by mouth daily.      . promethazine (PHENERGAN) 25 MG tablet Take 25 mg by mouth every 6 (six) hours as needed. For nausea      . nabumetone (RELAFEN) 750 MG tablet Take 750 mg by mouth 2 (two) times daily as needed. For migraine       . naratriptan (AMERGE) 2.5 MG tablet Take 2.5 mg by mouth as needed. Take one (1) tablet at onset of headache; if returns or does not resolve, may repeat after 4 hours; do not exceed five (5) mg in 24 hours.       No current facility-administered medications on file prior to visit.    Review of Systems:  As per HPI, otherwise negative.    Physical Examination: Filed Vitals:   11/06/13 1135  BP: 130/80  Pulse: 71  Temp: 98.3 F (36.8 C)  Resp: 18   Filed Vitals:   11/06/13 1135  Height: 5' 6.5" (1.689 m)  Weight: 160 lb (72.576 kg)   Body mass index is 25.44 kg/(m^2). Ideal Body Weight: Weight in (lb) to have BMI = 25: 156.9  GEN: WDWN, NAD, Non-toxic, A & O x 3 HEENT: Atraumatic, Normocephalic. Neck supple. No masses, No LAD. Ears and Nose: No external deformity. CV: RRR, No M/G/R. No JVD. No thrill. No extra heart sounds. PULM: CTA B, no wheezes, crackles, rhonchi. No retractions. No resp. distress. No accessory muscle use. ABD: S, epigastric tenderness, ND, +BS. No rebound. No HSM. EXTR: No c/c/e NEURO Normal gait.  PSYCH: Normally interactive. Conversant. Not depressed or anxious appearing.  Calm demeanor.    Assessment and Plan: Gastritis GERD Prevacid carafate Labs   Signed,  Ellison Carwin, MD

## 2013-11-06 NOTE — Patient Instructions (Signed)
Gastroesophageal Reflux Disease, Adult  Gastroesophageal reflux disease (GERD) happens when acid from your stomach flows up into the esophagus. When acid comes in contact with the esophagus, the acid causes soreness (inflammation) in the esophagus. Over time, GERD may create small holes (ulcers) in the lining of the esophagus.  CAUSES   · Increased body weight. This puts pressure on the stomach, making acid rise from the stomach into the esophagus.  · Smoking. This increases acid production in the stomach.  · Drinking alcohol. This causes decreased pressure in the lower esophageal sphincter (valve or ring of muscle between the esophagus and stomach), allowing acid from the stomach into the esophagus.  · Late evening meals and a full stomach. This increases pressure and acid production in the stomach.  · A malformed lower esophageal sphincter.  Sometimes, no cause is found.  SYMPTOMS   · Burning pain in the lower part of the mid-chest behind the breastbone and in the mid-stomach area. This may occur twice a week or more often.  · Trouble swallowing.  · Sore throat.  · Dry cough.  · Asthma-like symptoms including chest tightness, shortness of breath, or wheezing.  DIAGNOSIS   Your caregiver may be able to diagnose GERD based on your symptoms. In some cases, X-rays and other tests may be done to check for complications or to check the condition of your stomach and esophagus.  TREATMENT   Your caregiver may recommend over-the-counter or prescription medicines to help decrease acid production. Ask your caregiver before starting or adding any new medicines.   HOME CARE INSTRUCTIONS   · Change the factors that you can control. Ask your caregiver for guidance concerning weight loss, quitting smoking, and alcohol consumption.  · Avoid foods and drinks that make your symptoms worse, such as:  · Caffeine or alcoholic drinks.  · Chocolate.  · Peppermint or mint flavorings.  · Garlic and onions.  · Spicy foods.  · Citrus fruits,  such as oranges, lemons, or limes.  · Tomato-based foods such as sauce, chili, salsa, and pizza.  · Fried and fatty foods.  · Avoid lying down for the 3 hours prior to your bedtime or prior to taking a nap.  · Eat small, frequent meals instead of large meals.  · Wear loose-fitting clothing. Do not wear anything tight around your waist that causes pressure on your stomach.  · Raise the head of your bed 6 to 8 inches with wood blocks to help you sleep. Extra pillows will not help.  · Only take over-the-counter or prescription medicines for pain, discomfort, or fever as directed by your caregiver.  · Do not take aspirin, ibuprofen, or other nonsteroidal anti-inflammatory drugs (NSAIDs).  SEEK IMMEDIATE MEDICAL CARE IF:   · You have pain in your arms, neck, jaw, teeth, or back.  · Your pain increases or changes in intensity or duration.  · You develop nausea, vomiting, or sweating (diaphoresis).  · You develop shortness of breath, or you faint.  · Your vomit is green, yellow, black, or looks like coffee grounds or blood.  · Your stool is red, bloody, or black.  These symptoms could be signs of other problems, such as heart disease, gastric bleeding, or esophageal bleeding.  MAKE SURE YOU:   · Understand these instructions.  · Will watch your condition.  · Will get help right away if you are not doing well or get worse.  Document Released: 07/14/2005 Document Revised: 12/27/2011 Document Reviewed: 04/23/2011  Exit[employer]® Patient   Information ©2014 [employer].

## 2013-11-08 LAB — HELICOBACTER PYLORI  ANTIBODY, IGM: Helicobacter pylori, IgM: 2.2 U/mL (ref <9.0)

## 2013-11-09 ENCOUNTER — Ambulatory Visit (INDEPENDENT_AMBULATORY_CARE_PROVIDER_SITE_OTHER): Payer: BC Managed Care – PPO | Admitting: Family Medicine

## 2013-11-09 ENCOUNTER — Ambulatory Visit: Payer: BC Managed Care – PPO

## 2013-11-09 VITALS — BP 118/72 | HR 65 | Temp 98.2°F | Resp 18 | Ht 66.0 in | Wt 161.0 lb

## 2013-11-09 DIAGNOSIS — R1013 Epigastric pain: Secondary | ICD-10-CM

## 2013-11-09 LAB — POCT CBC
Granulocyte percent: 73.8 %G (ref 37–80)
HCT, POC: 46.9 % (ref 37.7–47.9)
Hemoglobin: 14.5 g/dL (ref 12.2–16.2)
Lymph, poc: 1.5 (ref 0.6–3.4)
MCH, POC: 29.5 pg (ref 27–31.2)
MCHC: 30.9 g/dL — AB (ref 31.8–35.4)
MCV: 95.5 fL (ref 80–97)
MID (CBC): 0.4 (ref 0–0.9)
MPV: 7.9 fL (ref 0–99.8)
PLATELET COUNT, POC: 290 10*3/uL (ref 142–424)
POC Granulocyte: 5.2 (ref 2–6.9)
POC LYMPH PERCENT: 21.1 %L (ref 10–50)
POC MID %: 5.1 %M (ref 0–12)
RBC: 4.91 M/uL (ref 4.04–5.48)
RDW, POC: 13.5 %
WBC: 7 10*3/uL (ref 4.6–10.2)

## 2013-11-09 NOTE — Patient Instructions (Signed)
Eat a mostly liquid diet this weekend for a few days  Continue the MiraLax. If not having bowel movements try taking twice a day for a couple of days  Use a fleets enema to get the colon cleaned out  If abruptly worse go to the emergency room. Would probably need a CT scan.  Plan to see your gastroenterologist as scheduled.  Consider seeing if you can get in to see your general surgeon earlier next week.

## 2013-11-09 NOTE — Progress Notes (Signed)
   Subjective:    Patient ID: [name], female    DOB: December 27, 1965, 48 y.o.   MRN: [id number]  HPI    Review of Systems     Objective:   Physical Exam        Assessment & Plan:

## 2013-11-09 NOTE — Progress Notes (Signed)
Subjective: Patient is here complaining of epigastric pain persisting. She she was here 3 days ago and treated with sulcrafate and protonix. Symptoms have not abated. In fact she feels a little bit worse. She has had some reflux. She does not have any chest pain or shortness of breath. She has not really got any nausea. Her bowels are chronically constipated she uses MiraLax for her.  she feels full.Cholecystectomy and BTL and appendetomy history. She does not menstruate regularly any longer.  Objective: Middle-aged lady in no major distress but does rub her stomach a lot. Chest is clear. Heart regular without murmurs. Abdomen has normal bowel sounds. Abdomen feels full. He is tender in the hypogastrium. Mild generalized tenderness.  Results for orders placed in visit on 11/09/13  POCT CBC      Result Value Range   WBC 7.0  4.6 - 10.2 K/uL   Lymph, poc 1.5  0.6 - 3.4   POC LYMPH PERCENT 21.1  10 - 50 %L   MID (cbc) 0.4  0 - 0.9   POC MID % 5.1  0 - 12 %M   POC Granulocyte 5.2  2 - 6.9   Granulocyte percent 73.8  37 - 80 %G   RBC 4.91  4.04 - 5.48 M/uL   Hemoglobin 14.5  12.2 - 16.2 g/dL   HCT, POC 46.9  37.7 - 47.9 %   MCV 95.5  80 - 97 fL   MCH, POC 29.5  27 - 31.2 pg   MCHC 30.9 (*) 31.8 - 35.4 g/dL   RDW, POC 13.5     Platelet Count, POC 290  142 - 424 K/uL   MPV 7.9  0 - 99.8 fL   [facility] reading (PRIMARY) by  Dr. Linna Darner Mild air fluid levels in small intestine without distended loops of small bowel.    .Surgical clips.  Assessment: Partial small bowel obstruction Epigastric pain Constipation  Plan; Continue miralax. Clear liquids Fleets Er if worse Gi ?surgeon

## 2013-11-12 ENCOUNTER — Telehealth: Payer: Self-pay

## 2013-11-12 DIAGNOSIS — K5900 Constipation, unspecified: Secondary | ICD-10-CM

## 2013-11-12 NOTE — Telephone Encounter (Signed)
FYI Note states GI. Ordered AMB referral.

## 2013-11-12 NOTE — Telephone Encounter (Signed)
Pt is asking about a referral that dr hopper was supposed to put on Friday and nothing in sys

## 2013-11-15 ENCOUNTER — Telehealth: Payer: Self-pay

## 2013-11-15 NOTE — Telephone Encounter (Signed)
Called and informed pt. She will be scheduled in urgent spot per AS for next week. Manuela Schwartz I told her pt 11/20/2013 at 11:30 am, and be here early. Can you put in the appt? Thanks!

## 2013-11-15 NOTE — Telephone Encounter (Signed)
Pt is aware of new OV and the OV with SF was cancelled.

## 2013-11-15 NOTE — Telephone Encounter (Signed)
Pt is scheduled to see Dr. Oneida Alar on 11/28/2013 and the note said Dr. Oneida Alar only. She said she doesn't mind who she sees. She just needs to find out exactly what is causing her abdominal pain.   It started about 2 weeks ago and the pain is right below her ribs in mid section. The pain is constant. She is eating soups, jello, etc and the pain continues.   She said she had been to Urgent Care, Doctor, etc 2-3 times in the last 2 weeks.   She said she was told that she might have a possible blockage.  She was given Prevacid and Carafate which is not helping much.  She is also taking Doculax and Miralax bid and can't seem to have a thorough BM.  I told her I will have one of the extenders review her info and I will call her back.

## 2013-11-15 NOTE — Telephone Encounter (Signed)
We have not seen patient in over 2 years.  I suggest moving up appt to next week. Seek medical attention if worsening symptoms.  Continue bland diet.

## 2013-11-20 ENCOUNTER — Ambulatory Visit (INDEPENDENT_AMBULATORY_CARE_PROVIDER_SITE_OTHER): Payer: BC Managed Care – PPO | Admitting: Gastroenterology

## 2013-11-20 ENCOUNTER — Encounter (HOSPITAL_COMMUNITY): Payer: Self-pay

## 2013-11-20 ENCOUNTER — Ambulatory Visit (HOSPITAL_COMMUNITY)
Admission: RE | Admit: 2013-11-20 | Discharge: 2013-11-20 | Disposition: A | Discharge status: Home / Self Care | Payer: BC Managed Care – PPO | Source: Ambulatory Visit | Attending: Gastroenterology | Admitting: Gastroenterology

## 2013-11-20 ENCOUNTER — Encounter: Payer: Self-pay | Admitting: Gastroenterology

## 2013-11-20 VITALS — BP 137/75 | HR 67 | Temp 98.3°F | Wt 160.2 lb

## 2013-11-20 DIAGNOSIS — R1013 Epigastric pain: Secondary | ICD-10-CM

## 2013-11-20 DIAGNOSIS — R933 Abnormal findings on diagnostic imaging of other parts of digestive tract: Secondary | ICD-10-CM | POA: Insufficient documentation

## 2013-11-20 LAB — HEPATIC FUNCTION PANEL
ALT: 27 U/L (ref 0–35)
AST: 25 U/L (ref 0–37)
Albumin: 4.1 g/dL (ref 3.5–5.2)
Alkaline Phosphatase: 85 U/L (ref 39–117)
BILIRUBIN DIRECT: 0.2 mg/dL (ref 0.0–0.3)
Indirect Bilirubin: 0.1 mg/dL — ABNORMAL LOW (ref 0.2–1.2)
TOTAL PROTEIN: 7.9 g/dL (ref 6.0–8.3)
Total Bilirubin: 0.3 mg/dL (ref 0.3–1.2)

## 2013-11-20 LAB — LIPASE: Lipase: 102 U/L — ABNORMAL HIGH (ref 11–59)

## 2013-11-20 MED ORDER — DEXLANSOPRAZOLE 60 MG PO CPDR: 60.0000 mg | DELAYED_RELEASE_CAPSULE | Freq: Every day | ORAL | Status: DC

## 2013-11-20 MED ORDER — IOHEXOL 300 MG/ML  SOLN
100.0000 mL | Freq: Once | INTRAMUSCULAR | Status: AC | PRN
  Administered 2013-11-20: 100 mL via INTRAVENOUS

## 2013-11-20 MED ORDER — LINACLOTIDE 145 MCG PO CAPS: 145.0000 ug | ORAL_CAPSULE | Freq: Every day | ORAL | Status: DC

## 2013-11-20 NOTE — Patient Instructions (Signed)
For constipation: stop Miralax. Start taking Linzess 1 capsule each morning on an empty stomach, 30 minutes before breakfast.   For reflux: stop Prevacid. Trial Dexilant 1 capsule each day.   Please have blood work and CT scan done today. We will call you with the results as soon as they are available. If these are negative, you will need an upper endoscopy with Dr. Oneida Alar.

## 2013-11-20 NOTE — Progress Notes (Signed)
Referring Provider: Asencion Noble, MD Primary Care Physician:  Asencion Noble, MD Primary GI: Dr. Oneida Alar   Chief Complaint  Patient presents with  . Abdominal Pain    HPI:   [name] presents today in an urgent slot secondary to acute on chronic abdominal pain. History significant for constipation, gastritis, last EGD in Nov 2012 with gastritis. Negative H.pylori. Known hepatic hemangioma. Abdominal xray in ED on Nov 09, 2013 showed non-obstructive bowel gas pattern. No recent CT scan. Recent CBC normal. H.pylori serology negative.   Jan 20th prescribed Prevacid and Carafate. No improvement. Presented again on Jan 23, told to follow-up with GI. Eating jello, applesauce, chicken noodle soup, pudding. Symptom onset 2.5 weeks ago. Main pain epigastric region but also diffusely bloated/gurgling. Took an enema and Dulcolax tablets on the 20th. Took a laxative again this weekend. Taking 2 doses of Miralax a day. Stool unproductive. Has had only 4 stools today, just a small amount. No N/V. Pain is constant. Pain sometimes aggravated by eating. No melena. No hematochezia. Severe GERD. Nocturnal symptoms. No dysphagia.   Relafen prn but not recently. Took Ibuprofen several days with severe pain but not on a regular basis. Prilosec, Nexium, Protonix in past.   Past Medical History  Diagnosis Date  . Glaucoma   . Chronic constipation   . GERD (gastroesophageal reflux disease)   . Hemorrhoids, internal, with bleeding DEC 2010 [facility]  . Migraines   . Aneurysm 2009 COILING    stent placed  . Abdominal pain OCT 2011 NL CBC, HFP, LIPASE    MRCP-SEPTATED GB, NL CBD, R LOBE HEMANGIOMA  . Dysrhythmia     "irregular heartbeat"  . PONV (postoperative nausea and vomiting)     Past Surgical History  Procedure Laterality Date  . Glaucoma surgery  MAR 2012  . Colonoscopy      DEC 2010 [facility]  . Tubal ligation    . Upper gastrointestinal endoscopy      OCT 2011 RING DILATED TO 16MM, CANDIDA ESOPHAGITIS,  CHRONIC GASTRITIS  . Anuerysm repair      coil  . Esophagogastroduodenoscopy  09/17/2011    Dr. Oneida Alar :moderate gastritis  . Appendectomy    . Eye surgery      glaucoma-unsure if both or just one of them  . Cholecystectomy  12/06/2011    Procedure: LAPAROSCOPIC CHOLECYSTECTOMY;  Surgeon: Donato Heinz, MD;  Location: [facility];  Service: General;  Laterality: N/A;    Current Outpatient Prescriptions  Medication Sig Dispense Refill  . amitriptyline (ELAVIL) 25 MG tablet Take 75 mg by mouth at bedtime.       Marland Kitchen aspirin EC 81 MG tablet Take 81 mg by mouth every other day.      . brimonidine (ALPHAGAN) 0.2 % ophthalmic solution Place 1 drop into the left eye 3 (three) times daily.       . dorzolamide-timolol (COSOPT) 22.3-6.8 MG/ML ophthalmic solution Place 1 drop into the left eye 2 (two) times daily.        . lansoprazole (PREVACID) 30 MG capsule Take 1 capsule (30 mg total) by mouth daily at 12 noon.  30 capsule  11  . nabumetone (RELAFEN) 750 MG tablet Take 750 mg by mouth 2 (two) times daily as needed. For migraine      . naratriptan (AMERGE) 2.5 MG tablet Take 2.5 mg by mouth as needed. Take one (1) tablet at onset of headache; if returns or does not resolve, may repeat after 4 hours; do  not exceed five (5) mg in 24 hours.      . polyethylene glycol (MIRALAX / GLYCOLAX) packet Take 17 g by mouth daily.      . promethazine (PHENERGAN) 25 MG tablet Take 25 mg by mouth every 6 (six) hours as needed. For nausea      . omeprazole (PRILOSEC) 20 MG capsule TAKE ONE CAPSULE BY MOUTH TWICE DAILY 30  MINUTES PRIOR TO MEALS  62 capsule  0  . sucralfate (CARAFATE) 1 G tablet Take on e tab 1 hr ac and hs  120 tablet  0  . sucralfate (CARAFATE) 1 GM/10ML suspension Take 10 mLs (1 g total) by mouth 4 (four) times daily.  420 mL  0   No current facility-administered medications for this visit.    Allergies as of 11/20/2013 - Review Complete 11/20/2013  Allergen Reaction Noted  . Augmentin  [amoxicillin-pot clavulanate] Diarrhea and Nausea And Vomiting 11/06/2013  . Codeine Nausea And Vomiting 09/17/2011  . Adhesive [tape] Rash 11/30/2011    Family History  Problem Relation Age of Onset  . Anesthesia problems Neg Hx   . Hypotension Neg Hx   . Malignant hyperthermia Neg Hx   . Pseudochol deficiency Neg Hx   . Heart disease Mother   . Diabetes Maternal Grandmother   . Stroke Paternal Grandmother   . Heart disease Paternal Grandfather     History   Social History  . Marital Status: Married    Spouse Name: N/A    Number of Children: 1  . 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 Smoking status: Never Smoker   . Smokeless tobacco: Never Used  . Alcohol Use: No  . Drug Use: No  . Sexual Activity: Yes    Partners: Male    Birth Control/ Protection: Surgical     Comment: spouse   Other Topics Concern  . None   Social History Narrative   Married.  Works at [employer].  One daughter born [place]: As mentioned in HPI.   Physical Exam: BP 137/75  Pulse 67  Temp(Src) 98.3 F (36.8 C) (Oral)  Wt 160 lb 3.2 oz (72.666 kg)  LMP 10/30/2013 General:   Alert and oriented. No distress noted. Pleasant and cooperative.  Head:  Normocephalic and atraumatic. Eyes:  Conjuctiva clear without scleral icterus. Mouth:  Oral mucosa pink and moist. Good dentition. No lesions. Neck:  Supple, without mass or thyromegaly. Heart:  S1, S2 present without murmurs, rubs, or gallops. Regular rate and rhythm. Abdomen:  +BS, soft, mild TTP epigastric region and non-distended. No rebound or guarding. No HSM or masses noted. Msk:  Symmetrical without gross deformities. Normal posture. Extremities:  Without edema. Neurologic:  Alert and  oriented x4;  grossly normal neurologically. Skin:  Intact without significant lesions or rashes. Psych:  Alert and cooperative. Normal mood and affect.

## 2013-11-21 ENCOUNTER — Other Ambulatory Visit: Payer: Self-pay | Admitting: Gastroenterology

## 2013-11-21 ENCOUNTER — Telehealth: Payer: Self-pay | Admitting: *Deleted

## 2013-11-21 DIAGNOSIS — R1013 Epigastric pain: Principal | ICD-10-CM

## 2013-11-21 DIAGNOSIS — G8929 Other chronic pain: Secondary | ICD-10-CM

## 2013-11-21 NOTE — Progress Notes (Signed)
Quick Note:  HFP normal.  Lipase elevated at 102. Non-specific.  CT without evidence of pancreatitis.  CT reports: 1. The bowel gas pattern is nonspecific and may reflect a mild small bowel ileus. 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 More distally however there are loops of mildly distended fluid and gas-filled small bowel present as well. No abnormal small bowel wall thickening is demonstrated. There is no evidence of an internal hernia. 2. Contrast has reached the normal-appearing colon. There is no evidence of colitis or diverticulitis. 3. There are findings in the posterior aspect of the right lobe of the liver most compatible with a hemangioma. There is no evidence of acute hepatobiliary, urinary tract, or gynecological abnormality.  Attempted to call patient on 2/3 but unable to reach. I spoke with her 2/4 in the early morning. Clinically, not felt to be dealing with an ileus. She is tolerating liquids, soft foods without N/V. Main complaint is pain. Doubt dealing with SMA syndrome. Her weight has remained stable for the past several years. Discussed briefly with Dr. Oneida Alar. Will plan on EGD this week to further sort out. Will review CT as well with radiologist. Patient asked to hold Linzess in interim. Continue Dexilant. Present to ED if severe abdominal pain, inability to tolerate fluids, abdominal distenion.   ______

## 2013-11-21 NOTE — Telephone Encounter (Signed)
Pt called stating she had blood work and a CT scan done yesterday, pt states that [place] called her yesterday for the results. Please advise [phone]

## 2013-11-21 NOTE — Telephone Encounter (Signed)
Patient informed of EGD appt. Will arrive at 11:30 for a 12:30 appt.   I have asked her to hold Linzess for now. CT non-specific with findings. Documented under result notes.

## 2013-11-21 NOTE — Telephone Encounter (Signed)
Routing to Anna

## 2013-11-21 NOTE — Telephone Encounter (Signed)
Patient is scheduled w/[facility] Friday Feb 6th at 12:30 I have called and [facility] for her to return my call to confirm

## 2013-11-21 NOTE — Telephone Encounter (Signed)
Reviewed results with patient. See under result notes.  Need to set up for an EGD with Dr. Oneida Alar ASAP. If she can do this Friday, that would be great. Patient is aware we will be calling her.

## 2013-11-21 NOTE — Assessment & Plan Note (Signed)
48 year old female with worsening acute on chronic abdominal pain, aggravated with eating. Negative H.pylori serology on file. CBC normal. Abd xray with non-obstructive bowel gas pattern. No improvement with Prevacid. Relafen prn in past. Previously tried Prilosec, Nexium, and Protonix. Last EGD in 2012 with gastritis. Worsening of symptoms noted, with patient only eating bland diet, clear liquids. Gallbladder absent. I feel we need to rule out acute issues and have ordered a stat lipase and HFP. Will proceed with CT scan today. If negative, needs EGD as soon as possible.   Change Prevacid to Dexilant. Samples provided Linzess 145 mcg daily for constipation HFP, lipase stat CT abd/pelvis stat EGD if negative CT

## 2013-11-22 NOTE — Progress Notes (Signed)
Quick Note:  Reviewed CT with Dr. Thornton Papas. There is evidence of a "possible" narrowing of duodenum but no obstruction. Stomach non-distended. Clinically does not match with patient symptoms. Could be an issue in future if weight loss occurs; patient is without N/V. Main complaint epigastric pain with eating. She DOES have a chronic history of abdominal pain. Proceed with EGD as planned. ______

## 2013-11-22 NOTE — Progress Notes (Signed)
cc'd to pcp 

## 2013-11-23 ENCOUNTER — Ambulatory Visit (HOSPITAL_COMMUNITY)
Admission: RE | Admit: 2013-11-23 | Discharge: 2013-11-23 | Disposition: A | Discharge status: Home / Self Care | Payer: BC Managed Care – PPO | Source: Ambulatory Visit | Attending: Gastroenterology | Admitting: Gastroenterology

## 2013-11-23 ENCOUNTER — Encounter (HOSPITAL_COMMUNITY): Payer: Self-pay | Admitting: *Deleted

## 2013-11-23 ENCOUNTER — Encounter (HOSPITAL_COMMUNITY)
Admission: RE | Disposition: A | Discharge status: Home / Self Care | Payer: Self-pay | Source: Ambulatory Visit | Attending: Gastroenterology

## 2013-11-23 ENCOUNTER — Encounter (HOSPITAL_COMMUNITY): Payer: Self-pay | Admitting: Pharmacy Technician

## 2013-11-23 DIAGNOSIS — K5710 Diverticulosis of small intestine without perforation or abscess without bleeding: Secondary | ICD-10-CM | POA: Insufficient documentation

## 2013-11-23 DIAGNOSIS — K3189 Other diseases of stomach and duodenum: Secondary | ICD-10-CM

## 2013-11-23 DIAGNOSIS — R1013 Epigastric pain: Secondary | ICD-10-CM

## 2013-11-23 DIAGNOSIS — Z7982 Long term (current) use of aspirin: Secondary | ICD-10-CM | POA: Insufficient documentation

## 2013-11-23 DIAGNOSIS — K2940 Chronic atrophic gastritis without bleeding: Secondary | ICD-10-CM | POA: Insufficient documentation

## 2013-11-23 DIAGNOSIS — G8929 Other chronic pain: Secondary | ICD-10-CM

## 2013-11-23 HISTORY — PX: ESOPHAGOGASTRODUODENOSCOPY: SHX5428

## 2013-11-23 SURGERY — EGD (ESOPHAGOGASTRODUODENOSCOPY)
Anesthesia: Moderate Sedation

## 2013-11-23 MED ORDER — MIDAZOLAM HCL 5 MG/5ML IJ SOLN
INTRAMUSCULAR | Status: AC
  Filled 2013-11-23: qty 10

## 2013-11-23 MED ORDER — SODIUM CHLORIDE 0.9 % IV SOLN
INTRAVENOUS | Status: DC
  Administered 2013-11-23: 12:00:00 via INTRAVENOUS

## 2013-11-23 MED ORDER — SODIUM CHLORIDE 0.9 % IJ SOLN
INTRAMUSCULAR | Status: AC
  Filled 2013-11-23: qty 10

## 2013-11-23 MED ORDER — STERILE WATER FOR IRRIGATION IR SOLN
Status: DC | PRN
  Administered 2013-11-23: 13:00:00

## 2013-11-23 MED ORDER — MIDAZOLAM HCL 5 MG/5ML IJ SOLN
INTRAMUSCULAR | Status: DC | PRN
  Administered 2013-11-23: 2 mg via INTRAVENOUS
  Administered 2013-11-23: 1 mg via INTRAVENOUS
  Administered 2013-11-23: 2 mg via INTRAVENOUS

## 2013-11-23 MED ORDER — PROMETHAZINE HCL 25 MG/ML IJ SOLN
12.5000 mg | Freq: Once | INTRAMUSCULAR | Status: AC
  Administered 2013-11-23: 12.5 mg via INTRAVENOUS

## 2013-11-23 MED ORDER — MEPERIDINE HCL 100 MG/ML IJ SOLN
INTRAMUSCULAR | Status: DC | PRN
  Administered 2013-11-23: 25 mg via INTRAVENOUS
  Administered 2013-11-23: 50 mg via INTRAVENOUS
  Administered 2013-11-23: 25 mg via INTRAVENOUS

## 2013-11-23 MED ORDER — PROMETHAZINE HCL 25 MG/ML IJ SOLN
INTRAMUSCULAR | Status: AC
  Filled 2013-11-23: qty 1

## 2013-11-23 MED ORDER — PROMETHAZINE HCL 25 MG/ML IJ SOLN
INTRAMUSCULAR | Status: DC | PRN
  Administered 2013-11-23: 12.5 mg via INTRAVENOUS

## 2013-11-23 MED ORDER — LIDOCAINE VISCOUS 2 % MT SOLN
OROMUCOSAL | Status: DC | PRN
  Administered 2013-11-23: 2 mL via OROMUCOSAL

## 2013-11-23 MED ORDER — LIDOCAINE VISCOUS 2 % MT SOLN
OROMUCOSAL | Status: AC
  Filled 2013-11-23: qty 15

## 2013-11-23 MED ORDER — MEPERIDINE HCL 100 MG/ML IJ SOLN
INTRAMUSCULAR | Status: AC
  Filled 2013-11-23: qty 2

## 2013-11-23 NOTE — Progress Notes (Signed)
REVIEWED.  

## 2013-11-23 NOTE — Discharge Instructions (Signed)
YOUR UPPER ABDOMINAL PAIN MAY BE DUE TO gastritis BECAUSE YOU'RE USING ASPIRIN, AND RELAFEN. I biopsied your stomach.   TAKE A PROBIOTIC DAILY FOR ONE MONTH ([facility], [place]). STOP IT IF YOU FEEL IT'S NOT MAKING A DIFFERENCE.  USE RELAFEN SPARINGLY.  [place].  CONTINUE YOUR WEIGHT LOSS EFFORTS. YOU SHOULD LOSE 10 LBS.  STRICTLY FOLLOW A SOFT MECHANICAL/LOW FAT DIET. SEE INFO BELOW. DO NOT EAT CHUNKS OF ANYTHING.  MEATS SHOULD BE CHOPPED OR GROUND ONLY.  YOUR BIOPSY WILL BE BACK IN 7 DAYS.  CALL IN 2 WEEKS IF SYMPTOMS ARE NOT BETTER.  FOLLOW UP IN APR 2015.    UPPER ENDOSCOPY AFTER CARE Read the instructions outlined below and refer to this sheet in the next week. These discharge instructions provide you with general information on caring for yourself after you leave the hospital. While your treatment has been planned according to the most current medical practices available, unavoidable complications occasionally occur. If you have any problems or questions after discharge, call DR. Babbette Dalesandro, [phone].  ACTIVITY  You may resume your regular activity, but move at a slower pace for the next 24 hours.   Take frequent rest periods for the next 24 hours.   Walking will help get rid of the air and reduce the bloated feeling in your belly (abdomen).   No driving for 24 hours (because of the medicine (anesthesia) used during the test).   You may shower.   Do not sign any important legal documents or operate any machinery for 24 hours (because of the anesthesia used during the test).    NUTRITION  Drink plenty of fluids.   You may resume your normal diet as instructed by your doctor.   Begin with a light meal and progress to your normal diet. Heavy or fried foods are harder to digest and may make you feel sick to your stomach (nauseated).   Avoid alcoholic beverages for 24 hours or as instructed.    MEDICATIONS  You may  resume your normal medications.   WHAT YOU CAN EXPECT TODAY  Some feelings of bloating in the abdomen.   Passage of more gas than usual.    IF YOU HAD A BIOPSY TAKEN DURING THE UPPER ENDOSCOPY:  Eat a soft diet IF YOU HAVE NAUSEA, BLOATING, ABDOMINAL PAIN, OR VOMITING.    FINDING OUT THE RESULTS OF YOUR TEST Not all test results are available during your visit. DR. Oneida Alar WILL CALL YOU WITHIN 7 DAYS OF YOUR PROCEDUE WITH YOUR RESULTS. Do not assume everything is normal if you have not heard from DR. Cecila Satcher IN ONE WEEK, CALL HER OFFICE AT [phone].  SEEK IMMEDIATE MEDICAL ATTENTION AND CALL THE OFFICE: [phone] IF:  You have more than a spotting of blood in your stool.   Your belly is swollen (abdominal distention).   You are nauseated or vomiting.   You have a temperature over 101F.   You have abdominal pain or discomfort that is severe or gets worse throughout the day.   Gastritis  Gastritis is an inflammation (the body's way of reacting to injury and/or infection) of the stomach. It is often caused by bacterial (germ) infections. It can also be caused BY ASPIRIN, BC/GOODY POWDER'S, (IBUPROFEN) MOTRIN, OR ALEVE (NAPROXEN), chemicals (including alcohol), SPICY FOODS, and medications. This illness may be associated with generalized malaise (feeling tired, not well), UPPER ABDOMINAL STOMACH cramps, and fever. One common bacterial cause of gastritis is an organism known as  H. Pylori. This can be treated with antibiotics.      TREATMENT There are a number of  medicines used to treat GASTRITIS including: Antacids.  ZANTAC Proton-pump inhibitors: DEXILANT  HOME CARE INSTRUCTIONS Eat 2-3 hours before going to bed.  Try to reach and maintain a healthy weight. LOSE 10-20 LBS Do not eat just a few very large meals. Instead, eat 4 TO 6 smaller meals throughout the day.  Try to identify foods and beverages that make your symptoms worse, and avoid these.  Avoid tight  clothing.  Do not exercise right after eating.   Low-Fat Diet BREADS, CEREALS, PASTA, RICE, DRIED PEAS, AND BEANS These products are high in carbohydrates and most are low in fat. Therefore, they can be increased in the diet as substitutes for fatty foods. They too, however, contain calories and should not be eaten in excess. Cereals can be eaten for snacks as well as for breakfast.  Include foods that contain fiber (fruits, vegetables, whole grains, and legumes). Research shows that fiber may lower blood cholesterol levels, especially the water-soluble fiber found in fruits, vegetables, oat products, and legumes. FRUITS AND VEGETABLES It is good to eat fruits and vegetables. Besides being sources of fiber, both are rich in vitamins and some minerals. They help you get the daily allowances of these nutrients. Fruits and vegetables can be used for snacks and desserts. MEATS Limit lean meat, chicken, [place], and fish to no more than 6 ounces per day. Beef, Pork, and Lamb Use lean cuts of beef, pork, and lamb. Lean cuts include:  Extra-lean ground beef.  Arm roast.  Sirloin tip.  Center-cut ham.  Round steak.  Loin chops.  Rump roast.  Tenderloin.  Trim all fat off the outside of meats before cooking. It is not necessary to severely decrease the intake of red meat, but lean choices should be made. Lean meat is rich in protein and contains a highly absorbable form of iron. Premenopausal women, in particular, should avoid reducing lean red meat because this could increase the risk for low red blood cells (iron-deficiency anemia).  Chicken and [place] These are good sources of protein. The fat of poultry can be reduced by removing the skin and underlying fat layers before cooking. Chicken and [place] can be substituted for lean red meat in the diet. Poultry should not be fried or covered with high-fat sauces. Fish and Shellfish Fish is a good source of protein. Shellfish contain cholesterol, but  they usually are low in saturated fatty acids. The preparation of fish is important. Like chicken and [place], they should not be fried or covered with high-fat sauces. EGGS Egg whites contain no fat or cholesterol. They can be eaten often. Try 1 to 2 egg whites instead of whole eggs in recipes or use egg substitutes that do not contain yolk.  MILK AND DAIRY PRODUCTS Use skim or 1% milk instead of 2% or whole milk. Decrease whole milk, natural, and processed cheeses. Use nonfat or low-fat (2%) cottage cheese or low-fat cheeses made from vegetable oils. Choose nonfat or low-fat (1 to 2%) yogurt. Experiment with evaporated skim milk in recipes that call for heavy cream. Substitute low-fat yogurt or low-fat cottage cheese for sour cream in dips and salad dressings. Have at least 2 servings of low-fat dairy products, such as 2 glasses of skim (or 1%) milk each day to help get your daily calcium intake.  FATS AND OILS Butterfat, lard, and beef fats are high in saturated fat and  cholesterol. These should be avoided.Vegetable fats do not contain cholesterol. AVOID coconut oil, palm oil, and palm kernel oil, WHICH are very high in saturated fats. These should be limited. These fats are often used in bakery goods, processed foods, popcorn, oils, and nondairy creamers. Vegetable shortenings and some peanut butters contain hydrogenated oils, which are also saturated fats. Read the labels on these foods and check for saturated vegetable oils.  Desirable liquid vegetable oils are corn oil, cottonseed oil, olive oil, canola oil, safflower oil, soybean oil, and sunflower oil. Peanut oil is not as good, but small amounts are acceptable. Buy a heart-healthy tub margarine that has no partially hydrogenated oils in the ingredients. AVOID Mayonnaise and salad dressings often are made from unsaturated fats.  OTHER EATING TIPS Snacks  Most sweets should be limited as snacks. They tend to be rich in calories and fats, and  their caloric content outweighs their nutritional value. Some good choices in snacks are graham crackers, melba toast, soda crackers, bagels (no egg), English muffins, fruits, and vegetables. These snacks are preferable to snack crackers, [place] fries, and chips. Popcorn should be air-popped or cooked in small amounts of liquid vegetable oil.  Desserts Eat fruit, low-fat yogurt, and fruit ices instead of pastries, cake, and cookies. Sherbet, angel food cake, gelatin dessert, frozen low-fat yogurt, or other frozen products that do not contain saturated fat (pure fruit juice bars, frozen ice pops) are also acceptable.   COOKING METHODS Choose those methods that use little or no fat. They include: Poaching.  Braising.  Steaming.  Grilling.  Baking.  Stir-frying.  Broiling.  Microwaving.  Foods can be cooked in a nonstick pan without added fat, or use a nonfat cooking spray in regular cookware. Limit fried foods and avoid frying in saturated fat. Add moisture to lean meats by using water, broth, cooking wines, and other nonfat or low-fat sauces along with the cooking methods mentioned above. Soups and stews should be chilled after cooking. The fat that forms on top after a few hours in the refrigerator should be skimmed off. When preparing meals, avoid using excess salt. Salt can contribute to raising blood pressure in some people.  EATING AWAY FROM HOME Order entres, potatoes, and vegetables without sauces or butter. When meat exceeds the size of a deck of cards (3 to 4 ounces), the rest can be taken home for another meal. Choose vegetable or fruit salads and ask for low-calorie salad dressings to be served on the side. Use dressings sparingly. Limit high-fat toppings, such as bacon, crumbled eggs, cheese, sunflower seeds, and olives. Ask for heart-healthy tub margarine instead of butter.

## 2013-11-23 NOTE — Op Note (Addendum)
[facility] [address] [place], [address]   OPERATIVE PROCEDURE REPORT  PATIENT: [name], [name]  MR#: [id number] BIRTHDATE: 1966-07-08 , 47  yrs. old GENDER: Female ENDOSCOPIST: Barney Drain, MD REFERRED BY:  Asencion Noble, M.D. PROCEDURE DATE: 11/23/2013 PROCEDURE:   Small bowel enteroscopy with biopsy ASA CLASS: INDICATIONS:1.  dyspepsia.   2.  epigastric pain. I PERSONALLY REVIEWED CT WITH DR. Thornton Papas. SLIGHT IMPINGEMENT OF SMA ON THIRD PORTION OF THE DUODENUM. PMHx: GASTRITIS/IBS WEIGHT: 158 LBS IN 2013 & NOW 160 LBS. MEDICATIONS: Demerol 100 mg IV, Versed 5 mg IV, and Promethazine (Phenergan) 12.5mg  IV x2 TOPICAL ANESTHETIC:   Viscous Xylocaine  DESCRIPTION OF PROCEDURE:   After the risks benefits and alternatives of the procedure were thoroughly explained, informed consent was obtained.  The [id number] ([id number])  endoscope was introduced through the mouth  and advanced to the proximal jejunum , limited by Without limitations.   The instrument was slowly withdrawn as the mucosa was fully examined.   NORMAL ESOPHAGUS. MODEARTE Gastritis was found in the body and the antrum of the stomach.  A biopsy for H.  pylori was taken.   A diverticulum was found in the second portion of the duodenum. OTHERWISE NORMAL DUODENUM AND JEJUNUM. UNBALE TO APPRECIATE OBSTRUCTION.  Re The scope was then withdrawn from the patient and the procedure terminated.  COMPLICATIONS: There were no complications.  ENDOSCOPIC IMPRESSION: 1.   MODERATE Gastritis in the body and the antrum of the stomach 2.   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  MAY STOP AFTER ONE MO IF IT'S NOT MAKING A DIFFERENCE. USE RELAFEN SPARINGLY. [place]. CONTINUE YOUR WEIGHT LOSS EFFORTS.  YOU SHOULD LOSE 10 LBS. STRICTLY FOLLOW A SOFT MECHANICAL/LOW FAT DIET.  DO NOT EAT CHUNKS OF ANYTHING.  MEATS  SHOULD BE CHOPPED OR GROUND ONLY. BIOPSY WILL BE BACK IN 7 DAYS. CALL IN 2 WEEKS IF SYMPTOMS ARE NOT BETTER.  IF NOT PT NEEDS A HYDROGEN BREATH TEST TO LOOK FOR SMALL BOWEL BACTERIAL OVERGROWTH. FOLLOW UP IN APR 2015.  REPEAT EXAM:  _______________________________ Lorrin MaisBarney Drain, MD 11/23/2013 2:42 PM Revised: 11/23/2013 2:42 PM  CC:

## 2013-11-23 NOTE — H&P (Signed)
Primary Care Physician:  Asencion Noble, MD Primary Gastroenterologist:  Dr. Oneida Alar  Pre-Procedure History & Physical: HPI:  [name] is a 48 y.o. female here for ABDOMINAL PAIN/DYSPEPSIA.  Past Medical History  Diagnosis Date  . Glaucoma   . Chronic constipation   . GERD (gastroesophageal reflux disease)   . Hemorrhoids, internal, with bleeding DEC 2010 TCS  . Migraines   . Aneurysm 2009 COILING    stent placed  . Abdominal pain OCT 2011 NL CBC, HFP, LIPASE    MRCP-SEPTATED GB, NL CBD, R LOBE HEMANGIOMA  . Dysrhythmia     "irregular heartbeat"  . PONV (postoperative nausea and vomiting)     Past Surgical History  Procedure Laterality Date  . Glaucoma surgery  MAR 2012  . Colonoscopy      DEC 2010 IH  . Tubal ligation    . Upper gastrointestinal endoscopy      OCT 2011 RING DILATED TO 16MM, CANDIDA ESOPHAGITIS, CHRONIC GASTRITIS  . Anuerysm repair      coil  . Esophagogastroduodenoscopy  09/17/2011    Dr. Oneida Alar :moderate gastritis  . Appendectomy    . Eye surgery      glaucoma-unsure if both or just one of them  . Cholecystectomy  12/06/2011    Procedure: LAPAROSCOPIC CHOLECYSTECTOMY;  Surgeon: Donato Heinz, MD;  Location: [facility];  Service: General;  Laterality: N/A;    Prior to Admission medications   Medication Sig Start Date End Date Taking? Authorizing Provider  amitriptyline (ELAVIL) 25 MG tablet Take 75 mg by mouth at bedtime.    Yes Historical Provider, MD  aspirin EC 81 MG tablet Take 81 mg by mouth every other day.   Yes Historical Provider, MD  brimonidine (ALPHAGAN) 0.2 % ophthalmic solution Place 1 drop into the left eye 3 (three) times daily.    Yes Historical Provider, MD  dexlansoprazole (DEXILANT) 60 MG capsule Take 1 capsule (60 mg total) by mouth daily. 11/20/13  Yes Orvil Feil, NP  dorzolamide-timolol (COSOPT) 22.3-6.8 MG/ML ophthalmic solution Place 1 drop into the left eye 2 (two) times daily.     Yes Historical Provider, MD  Linaclotide  Rolan Lipa) 145 MCG CAPS capsule Take 1 capsule (145 mcg total) by mouth daily. 30 minutes before breakfast. 11/20/13   Orvil Feil, NP  nabumetone (RELAFEN) 750 MG tablet Take 750 mg by mouth 2 (two) times daily as needed. For migraine    Historical Provider, MD  promethazine (PHENERGAN) 25 MG tablet Take 25 mg by mouth every 6 (six) hours as needed. For nausea    Historical Provider, MD    Allergies as of 11/21/2013 - Review Complete 11/20/2013  Allergen Reaction Noted  . Augmentin [amoxicillin-pot clavulanate] Diarrhea and Nausea And Vomiting 11/06/2013  . Codeine Nausea And Vomiting 09/17/2011  . Adhesive [tape] Rash 11/30/2011    Family History  Problem Relation Age of Onset  . Anesthesia problems Neg Hx   . Hypotension Neg Hx   . Malignant hyperthermia Neg Hx   . Pseudochol deficiency Neg Hx   . Heart disease Mother   . Diabetes Maternal Grandmother   . Stroke Paternal Grandmother   . Heart disease Paternal Grandfather     History   Social History  . Marital Status: Married    Spouse Name: N/A    Number of Children: 1  . 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 Smoking status: Never  Smoker   . Smokeless tobacco: Never Used  . Alcohol Use: No  . Drug Use: No  . Sexual Activity: Yes    Partners: Male    Birth Control/ Protection: Surgical     Comment: spouse   Other Topics Concern  . Not on file   Social History Narrative   Married.  Works at [employer].  One daughter born [place]: See HPI, otherwise negative ROS   Physical Exam: BP 159/86  Pulse 66  Temp(Src) 98.3 F (36.8 C) (Oral)  Resp 14  Ht 5\' 6"  (1.676 m)  Wt 160 lb (72.576 kg)  BMI 25.84 kg/m2  SpO2 100%  LMP 10/30/2013 General:   Alert,  pleasant and cooperative in NAD Head:  Normocephalic and atraumatic. Neck:  Supple; Lungs:  Clear throughout to auscultation.    Heart:  Regular rate and rhythm. Abdomen:  Soft, nontender  and nondistended. Normal bowel sounds, without guarding, and without rebound.   Neurologic:  Alert and  oriented x4;  grossly normal neurologically.  Impression/Plan:     ABDOMINAL PAIN/DYSPEPSIA.  PLAN: 1. EGD TODAY

## 2013-11-27 ENCOUNTER — Encounter (HOSPITAL_COMMUNITY): Payer: Self-pay | Admitting: Gastroenterology

## 2013-11-28 ENCOUNTER — Ambulatory Visit: Payer: Managed Care, Other (non HMO) | Admitting: Gastroenterology

## 2013-11-30 ENCOUNTER — Telehealth: Payer: Self-pay | Admitting: Gastroenterology

## 2013-11-30 NOTE — Telephone Encounter (Signed)
Please call pt. HER stomach Bx shows gastritis. YOUR UPPER ABDOMINAL PAIN MAY BE DUE TO gastritis FROM ASA/REFALFEN AND CONSTIPATION.    TAKE A PROBIOTIC DAILY FOR ONE MONTH ([facility] BRAND, [place]). STOP AFTER ONE MONTH  IF YOU FEEL IT'S NOT MAKING A DIFFERENCE. USE RELAFEN SPARINGLY. [place]. CONTINUE YOUR WEIGHT LOSS EFFORTS.  STRICTLY FOLLOW A SOFT MECHANICAL/LOW FAT DIET.  DO NOT EAT CHUNKS OF ANYTHING.  MEATS SHOULD BE CHOPPED OR GROUND ONLY.  CALL IN 2 WEEKS IF SYMPTOMS ARE NOT BETTER.  FOLLOW UP IN APR 2015 E30 ABD PAIN/CONSTIPATION W/ SLF OR AS.

## 2013-12-03 ENCOUNTER — Telehealth: Payer: Self-pay | Admitting: *Deleted

## 2013-12-03 NOTE — Telephone Encounter (Signed)
I called pt and informed of results. She said that she is still having abdominal pain right below her ribs. It is constant and sometimes gets worse. She is eating soft foods and watching her diet, no greasy or fried foods. Please advise!

## 2013-12-03 NOTE — Telephone Encounter (Signed)
See separate result note.

## 2013-12-03 NOTE — Telephone Encounter (Signed)
Reminder in Epic 

## 2013-12-03 NOTE — Telephone Encounter (Signed)
Called and informed pt.  

## 2013-12-03 NOTE — Telephone Encounter (Signed)
PLEASE CALL PT. SHE SHOULD CONTINUE WHAT SHE IS DOING. CALL IN 2 WEEKS IF SYMPTOMS ARE NOT IMPROVED.

## 2013-12-03 NOTE — Telephone Encounter (Signed)
Pt called to get her results, pt is aware that it takes 7 business days to get results back, pt said she had her procedure done the 6th. Please advise [phone]

## 2013-12-06 ENCOUNTER — Telehealth: Payer: Self-pay | Admitting: General Practice

## 2013-12-06 MED ORDER — LINACLOTIDE 290 MCG PO CAPS: 290.0000 ug | ORAL_CAPSULE | Freq: Every day | ORAL | Status: DC

## 2013-12-06 NOTE — Telephone Encounter (Signed)
Patient is experiencing some constipation and bloating with this medication.  Patient would like a nurse to give her a call.  Routing to [employer].

## 2013-12-06 NOTE — Telephone Encounter (Signed)
Pt has no way to pick up samples because of her work schedule and she would like a prescription sent in for it.

## 2013-12-06 NOTE — Telephone Encounter (Signed)
I called pt and she is taking the Linzess 145 mcg daily and she is still having constipation and having to use enemas some.   Please advise!

## 2013-12-06 NOTE — Telephone Encounter (Signed)
Provide Linzess 290 mcg samples. PR next week.

## 2013-12-06 NOTE — Telephone Encounter (Signed)
Sent to pharmacy 

## 2013-12-06 NOTE — Addendum Note (Signed)
Addended by: Orvil Feil on: 12/06/2013 02:08 PM   Modules accepted: Orders

## 2014-02-04 ENCOUNTER — Other Ambulatory Visit: Payer: Self-pay | Admitting: Obstetrics and Gynecology

## 2014-02-04 DIAGNOSIS — R928 Other abnormal and inconclusive findings on diagnostic imaging of breast: Secondary | ICD-10-CM

## 2014-02-06 ENCOUNTER — Other Ambulatory Visit (HOSPITAL_COMMUNITY): Payer: Self-pay | Admitting: Interventional Radiology

## 2014-02-06 DIAGNOSIS — I729 Aneurysm of unspecified site: Secondary | ICD-10-CM

## 2014-02-06 DIAGNOSIS — R519 Headache, unspecified: Secondary | ICD-10-CM

## 2014-02-06 DIAGNOSIS — R51 Headache: Secondary | ICD-10-CM

## 2014-02-06 DIAGNOSIS — H9319 Tinnitus, unspecified ear: Secondary | ICD-10-CM

## 2014-02-11 ENCOUNTER — Ambulatory Visit (HOSPITAL_COMMUNITY): Admission: RE | Admit: 2014-02-11 | Payer: BC Managed Care – PPO | Source: Ambulatory Visit

## 2014-02-11 ENCOUNTER — Ambulatory Visit (HOSPITAL_COMMUNITY): Payer: BC Managed Care – PPO

## 2014-02-12 ENCOUNTER — Ambulatory Visit (HOSPITAL_COMMUNITY)
Admission: RE | Admit: 2014-02-12 | Discharge: 2014-02-12 | Disposition: A | Discharge status: Home / Self Care | Payer: BC Managed Care – PPO | Source: Ambulatory Visit | Attending: Interventional Radiology | Admitting: Interventional Radiology

## 2014-02-12 ENCOUNTER — Other Ambulatory Visit (HOSPITAL_COMMUNITY): Payer: BC Managed Care – PPO

## 2014-02-12 DIAGNOSIS — H9319 Tinnitus, unspecified ear: Secondary | ICD-10-CM

## 2014-02-12 DIAGNOSIS — I729 Aneurysm of unspecified site: Secondary | ICD-10-CM

## 2014-02-12 DIAGNOSIS — R51 Headache: Secondary | ICD-10-CM

## 2014-02-12 DIAGNOSIS — R519 Headache, unspecified: Secondary | ICD-10-CM

## 2014-02-13 ENCOUNTER — Ambulatory Visit
Admission: RE | Admit: 2014-02-13 | Discharge: 2014-02-13 | Disposition: A | Discharge status: Home / Self Care | Payer: BC Managed Care – PPO | Source: Ambulatory Visit | Attending: Obstetrics and Gynecology | Admitting: Obstetrics and Gynecology

## 2014-02-13 DIAGNOSIS — R928 Other abnormal and inconclusive findings on diagnostic imaging of breast: Secondary | ICD-10-CM

## 2014-02-18 ENCOUNTER — Telehealth (HOSPITAL_COMMUNITY): Payer: Self-pay | Admitting: Interventional Radiology

## 2014-02-18 NOTE — Telephone Encounter (Signed)
Called pt, left VM for her to call me to reschedule her MRI/MRA [name]

## 2014-02-20 ENCOUNTER — Other Ambulatory Visit: Payer: Self-pay | Admitting: Obstetrics and Gynecology

## 2014-02-21 ENCOUNTER — Ambulatory Visit: Payer: BC Managed Care – PPO | Admitting: Gastroenterology

## 2014-02-26 ENCOUNTER — Ambulatory Visit (HOSPITAL_COMMUNITY): Admission: RE | Admit: 2014-02-26 | Payer: BC Managed Care – PPO | Source: Ambulatory Visit

## 2014-02-27 ENCOUNTER — Ambulatory Visit (HOSPITAL_COMMUNITY): Admission: RE | Admit: 2014-02-27 | Payer: BC Managed Care – PPO | Source: Ambulatory Visit

## 2014-02-27 ENCOUNTER — Ambulatory Visit (HOSPITAL_COMMUNITY)
Admission: RE | Admit: 2014-02-27 | Discharge: 2014-02-27 | Disposition: A | Discharge status: Home / Self Care | Payer: BC Managed Care – PPO | Source: Ambulatory Visit | Attending: Interventional Radiology | Admitting: Interventional Radiology

## 2014-02-27 ENCOUNTER — Other Ambulatory Visit (HOSPITAL_COMMUNITY): Payer: Self-pay | Admitting: Interventional Radiology

## 2014-02-27 DIAGNOSIS — I729 Aneurysm of unspecified site: Secondary | ICD-10-CM

## 2014-02-27 DIAGNOSIS — R519 Headache, unspecified: Secondary | ICD-10-CM

## 2014-02-27 DIAGNOSIS — R51 Headache: Secondary | ICD-10-CM

## 2014-02-27 DIAGNOSIS — H9319 Tinnitus, unspecified ear: Secondary | ICD-10-CM | POA: Insufficient documentation

## 2014-02-27 MED ORDER — GADOBENATE DIMEGLUMINE 529 MG/ML IV SOLN
16.0000 mL | Freq: Once | INTRAVENOUS | Status: AC
  Administered 2014-02-27: 16 mL via INTRAVENOUS

## 2014-03-05 ENCOUNTER — Telehealth: Payer: Self-pay | Admitting: *Deleted

## 2014-03-05 NOTE — Telephone Encounter (Signed)
I called pt and she said the Linzess 290 mcg daily is not working. It was increased from 145 mcg ot 290 mcg.  She will have a Bm 3-4 days and then she will go 3-4 days without a BM.  She recently had to take a laxative to have a BM. Please advise!

## 2014-03-05 NOTE — Telephone Encounter (Addendum)
Pt has some questions about the medication she is taking, pt is taking linzess. Please advise [phone]

## 2014-03-06 NOTE — Telephone Encounter (Signed)
Pt called and was informed.  

## 2014-03-06 NOTE — Telephone Encounter (Signed)
Let's trial Miralax each evening on top of Linzess.  May need to try samples of Amitiza. When we got some, let's provide Amitiza 24 mcg.

## 2014-03-12 ENCOUNTER — Telehealth: Payer: Self-pay | Admitting: Gastroenterology

## 2014-03-12 ENCOUNTER — Encounter: Payer: Self-pay | Admitting: Gastroenterology

## 2014-03-12 ENCOUNTER — Ambulatory Visit: Payer: BC Managed Care – PPO | Admitting: Gastroenterology

## 2014-03-12 NOTE — Telephone Encounter (Signed)
Pt was a no show

## 2014-03-13 NOTE — Telephone Encounter (Signed)
Please send letter.

## 2014-03-13 NOTE — Telephone Encounter (Signed)
She responded to [place] via mychart yesterday. She said that she had cancelled this OV from yesterday and i have [facility] her for 6/24 at 0930 with AS

## 2014-04-10 ENCOUNTER — Ambulatory Visit (INDEPENDENT_AMBULATORY_CARE_PROVIDER_SITE_OTHER): Payer: BC Managed Care – PPO | Admitting: Gastroenterology

## 2014-04-10 ENCOUNTER — Encounter: Payer: Self-pay | Admitting: Gastroenterology

## 2014-04-10 VITALS — BP 127/63 | HR 55 | Temp 97.8°F | Resp 18 | Ht 66.5 in | Wt 160.0 lb

## 2014-04-10 DIAGNOSIS — K219 Gastro-esophageal reflux disease without esophagitis: Secondary | ICD-10-CM

## 2014-04-10 DIAGNOSIS — K5900 Constipation, unspecified: Secondary | ICD-10-CM

## 2014-04-10 MED ORDER — DEXLANSOPRAZOLE 60 MG PO CPDR: 60.0000 mg | DELAYED_RELEASE_CAPSULE | Freq: Every day | ORAL | Status: DC

## 2014-04-10 NOTE — Assessment & Plan Note (Signed)
Taking Linzess 290. Unpredictable, having to utilize Miralax at times. Will trial Amitiza 24 mcg po BID, samples provided. Patient to call if she would like to continue this regimen. Return in 6 months.

## 2014-04-10 NOTE — Patient Instructions (Signed)
Start taking Amitiza 1 capsule with meals twice a day. Call us in about a week to let us know if you need a full prescription.  We will see you back in 6 months.   Please call with any issues in the meantime!

## 2014-04-10 NOTE — Progress Notes (Signed)
Referring Erhardt Dada: Asencion Noble, MD Primary Care Physician:  Asencion Noble, MD Primary GI: Dr. Oneida Alar   Chief Complaint  Patient presents with  . Follow-up    HPI:   [name] presents today with history of chronic abdominal pain, constipation, gastritis. 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 Clinically, did not feel to be dealing with this. EGD with mild chronic gastritis, single duodenal diverticulum. Recommend HBT if persistent issues.  No abdominal pain. Takes 2 MVIs every day. Probiotic daily. If only takes Linzess will have constipation, diarrhea, alternates. Now takes Linzess daily in conjunction with Miralax, which has improved regularity.   Past Medical History  Diagnosis Date  . Glaucoma   . Chronic constipation   . GERD (gastroesophageal reflux disease)   . Hemorrhoids, internal, with bleeding DEC 2010 TCS  . Migraines   . Aneurysm 2009 COILING    stent placed  . Abdominal pain OCT 2011 NL CBC, HFP, LIPASE    MRCP-SEPTATED GB, NL CBD, R LOBE HEMANGIOMA  . Dysrhythmia     "irregular heartbeat"  . PONV (postoperative nausea and vomiting)     Past Surgical History  Procedure Laterality Date  . Glaucoma surgery  MAR 2012  . Colonoscopy      DEC 2010 IH  . Tubal ligation    . Upper gastrointestinal endoscopy      OCT 2011 RING DILATED TO 16MM, CANDIDA ESOPHAGITIS, CHRONIC GASTRITIS  . Anuerysm repair      coil  . Esophagogastroduodenoscopy  09/17/2011    Dr. Oneida Alar :moderate gastritis  . Appendectomy    . Eye surgery      glaucoma-unsure if both or just one of them  . Cholecystectomy  12/06/2011    Procedure: LAPAROSCOPIC CHOLECYSTECTOMY;  Surgeon: Donato Heinz, MD;  Location: [facility];  Service: General;  Laterality: N/A;  . Esophagogastroduodenoscopy N/A 11/23/2013    Dr. Theophilus Bones Gastritis in the body and the antrum of the stomach/ SINGLE diverticulum in the second portion of the duodenum, mild chronic gastritis on path.  Negative H.pylori. Pursue HBT if persistent issues.     Current Outpatient Prescriptions  Medication Sig Dispense Refill  . amitriptyline (ELAVIL) 25 MG tablet Take 75 mg by mouth at bedtime.       Marland Kitchen aspirin EC 81 MG tablet Take 81 mg by mouth every other day.      . brimonidine (ALPHAGAN) 0.2 % ophthalmic solution Place 1 drop into the left eye 3 (three) times daily.       Marland Kitchen dexlansoprazole (DEXILANT) 60 MG capsule Take 1 capsule (60 mg total) by mouth daily.  30 capsule  3  . dorzolamide-timolol (COSOPT) 22.3-6.8 MG/ML ophthalmic solution Place 1 drop into the left eye 2 (two) times daily.        . Linaclotide (LINZESS) 290 MCG CAPS capsule Take 1 capsule (290 mcg total) by mouth daily. 30 minutes prior to breakfast  30 capsule  5   No current facility-administered medications for this visit.    Allergies as of 04/10/2014 - Review Complete 04/10/2014  Allergen Reaction Noted  . Augmentin [amoxicillin-pot clavulanate] Diarrhea and Nausea And Vomiting 11/06/2013  . Codeine Nausea And Vomiting 09/17/2011  . Adhesive [tape] Rash 11/30/2011    Family History  Problem Relation Age of Onset  . Anesthesia problems Neg Hx   . Hypotension Neg Hx   . Malignant hyperthermia Neg Hx   . Pseudochol deficiency Neg Hx   . Heart  disease Mother   . Diabetes Maternal Grandmother   . Stroke Paternal Grandmother   . Heart disease Paternal Grandfather     History   Social History  . Marital Status: Married    Spouse Name: N/A    Number of Children: 1  . 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 Smoking status: Never Smoker   . Smokeless tobacco: Never Used  . Alcohol Use: No  . Drug Use: No  . Sexual Activity: Yes    Partners: Male    Birth Control/ Protection: Surgical     Comment: spouse   Other Topics Concern  . None   Social History Narrative   Married.  Works at [employer].  One daughter born [place]: As  mentioned in HPI  Physical Exam: BP 127/63  Pulse 55  Temp(Src) 97.8 F (36.6 C) (Oral)  Resp 18  Ht 5' 6.5" (1.689 m)  Wt 160 lb (72.576 kg)  BMI 25.44 kg/m2 General:   Alert and oriented. No distress noted. Pleasant and cooperative.  Head:  Normocephalic and atraumatic. Eyes:  Conjuctiva clear without scleral icterus. Abdomen:  +BS, soft, non-tender and non-distended. No rebound or guarding. No HSM or masses noted. Msk:  Symmetrical without gross deformities. Normal posture. Extremities:  Without edema. Neurologic:  Alert and  oriented x4;  grossly normal neurologically. Skin:  Intact without significant lesions or rashes. Psych:  Alert and cooperative. Normal mood and affect.

## 2014-04-10 NOTE — Assessment & Plan Note (Signed)
Doing well with Dexilant daily. Refills X 1 year provided. Return in 6 months.

## 2014-04-10 NOTE — Progress Notes (Signed)
cc'd to pcp 

## 2014-04-25 ENCOUNTER — Telehealth: Payer: Self-pay | Admitting: *Deleted

## 2014-04-25 MED ORDER — LUBIPROSTONE 24 MCG PO CAPS: 24.0000 ug | ORAL_CAPSULE | Freq: Two times a day (BID) | ORAL | Status: DC

## 2014-04-25 NOTE — Telephone Encounter (Signed)
Done. Amitiza 24 mcg po BID sent to pharmacy.

## 2014-04-25 NOTE — Telephone Encounter (Signed)
LM with lady to tell pt her Rx was sent in.

## 2014-04-25 NOTE — Telephone Encounter (Signed)
Pt called stating she was seen here 2 weeks ago by Vicente Males and pt said Vicente Males gave her samples of Amitiza and wanted pt to call her back to let her know how medication was working. Per pt the medication is working and she would like a Careers information officer of it at [employer] in [place]. Please advise

## 2014-07-30 ENCOUNTER — Telehealth: Payer: Self-pay

## 2014-07-30 NOTE — Telephone Encounter (Signed)
Pt called and said the Dexilant is no longer working, she is having a lot of reflux and she wants to know if Vicente Males can change it to something else.  She also said she cannot take the Amitiza bid, it makes her nauseated, but not if she takes it once. When she takes it once it is not helping enough and she had to take a laxative on Friday. Then she had BM's Sat and Sun but did not have one yesterday. Please advise!

## 2014-07-31 NOTE — Telephone Encounter (Signed)
Called pt. She said she had recently tried Prilosec, Prevacid and years ago she tried Nexium and Zantac. She will try the Protonix 4 mg and I called in #30 with 5 refills per Bethany Emperor, NP to take one daily.  IBgard samples at the front and she is aware to take one daily.

## 2014-07-31 NOTE — Telephone Encounter (Signed)
Do we know what she has taken in the past other than Dexilant? We could trial Protonix 40 mg once daily, disp #3o with 5 refills. I'm not sure if she has tried that or not.   Linzess did not work well for her, as it was unpredictable. She can stay on Amitiza once daily, utilizing Miralax as needed. Let's give her samples of IBgard as well. Make sure she is taking the Amitiza with a full meal.

## 2014-07-31 NOTE — Telephone Encounter (Signed)
Pt called again this morning and I told her that [name] was very busy yesterday, but I will let her know that pt has called again.

## 2014-08-18 DIAGNOSIS — K6389 Other specified diseases of intestine: Secondary | ICD-10-CM

## 2014-08-18 DIAGNOSIS — K638219 Small intestinal bacterial overgrowth, unspecified: Secondary | ICD-10-CM

## 2014-08-18 HISTORY — DX: Other specified diseases of intestine: K63.89

## 2014-08-18 HISTORY — DX: Small intestinal bacterial overgrowth, unspecified: K63.8219

## 2014-08-22 ENCOUNTER — Encounter: Payer: Self-pay | Admitting: Gastroenterology

## 2014-08-22 ENCOUNTER — Other Ambulatory Visit: Payer: Self-pay

## 2014-08-22 ENCOUNTER — Ambulatory Visit (INDEPENDENT_AMBULATORY_CARE_PROVIDER_SITE_OTHER): Payer: BC Managed Care – PPO | Admitting: Gastroenterology

## 2014-08-22 VITALS — BP 145/72 | HR 60 | Temp 97.5°F | Ht 66.0 in | Wt 161.0 lb

## 2014-08-22 DIAGNOSIS — K589 Irritable bowel syndrome without diarrhea: Secondary | ICD-10-CM | POA: Insufficient documentation

## 2014-08-22 DIAGNOSIS — K219 Gastro-esophageal reflux disease without esophagitis: Secondary | ICD-10-CM

## 2014-08-22 NOTE — Progress Notes (Signed)
Referring Provider: Asencion Noble, MD Primary Care Physician:  Asencion Noble, MD  Primary GI: Dr. Oneida Alar   Chief Complaint  Patient presents with  . Follow-up    HPI:   [name] presents today with history of chronic abdominal pain, constipation, gastritis. 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 Clinically, did not feel to be dealing with this. EGD with mild chronic gastritis, single duodenal diverticulum. Recommend HBT if persistent issues.  Linzess without significant improvement; trial of Amitiza 24 mcg BID given in July 2015 with good response.   Could only take Amitiza once per day due to nausea. Took with full stomach. Takes at night. Not helping enough. Started taking Miralax in the morning, 1/2 dose of Miralax at night and one Amitiza. Started this regimen this week. Things are moving. 1 BM daily, most of the time feels productive. Sometimes doesn't feel like completely empties. Lately a lot of chicken. Not a big veggie eater. Having a lot of gas lately. Peas, corn, green beans. Not much improvement with Protonix. Tried Prilosec, Prevacid, Nexium in the past. Alka seltzer chewable intermittently but not every day.  Dexilant did well until the last month. Not eating after 7 pm. Probiotic daily.    Past Medical History  Diagnosis Date  . Glaucoma   . Chronic constipation   . GERD (gastroesophageal reflux disease)   . Hemorrhoids, internal, with bleeding DEC 2010 TCS  . Migraines   . Aneurysm 2009 COILING    stent placed  . Abdominal pain OCT 2011 NL CBC, HFP, LIPASE    MRCP-SEPTATED GB, NL CBD, R LOBE HEMANGIOMA  . Dysrhythmia     "irregular heartbeat"  . PONV (postoperative nausea and vomiting)     Past Surgical History  Procedure Laterality Date  . Glaucoma surgery  MAR 2012  . Colonoscopy      DEC 2010 IH  . Tubal ligation    . Upper gastrointestinal endoscopy      OCT 2011 RING DILATED TO 16MM, CANDIDA ESOPHAGITIS, CHRONIC GASTRITIS  .  Anuerysm repair      coil  . Esophagogastroduodenoscopy  09/17/2011    Dr. Oneida Alar :moderate gastritis  . Appendectomy    . Eye surgery      glaucoma-unsure if both or just one of them  . Cholecystectomy  12/06/2011    Procedure: LAPAROSCOPIC CHOLECYSTECTOMY;  Surgeon: Donato Heinz, MD;  Location: [facility];  Service: General;  Laterality: N/A;  . Esophagogastroduodenoscopy N/A 11/23/2013    Dr. Theophilus Bones Gastritis in the body and the antrum of the stomach/ SINGLE diverticulum in the second portion of the duodenum, mild chronic gastritis on path. Negative H.pylori. Pursue HBT if persistent issues.     Current Outpatient Prescriptions  Medication Sig Dispense Refill  . amitriptyline (ELAVIL) 25 MG tablet Take 75 mg by mouth at bedtime.     Marland Kitchen aspirin EC 81 MG tablet Take 81 mg by mouth every other day.    . brimonidine (ALPHAGAN) 0.2 % ophthalmic solution Place 1 drop into the left eye 3 (three) times daily.     . dorzolamide-timolol (COSOPT) 22.3-6.8 MG/ML ophthalmic solution Place 1 drop into the left eye 2 (two) times daily.      Marland Kitchen lubiprostone (AMITIZA) 24 MCG capsule Take 1 capsule (24 mcg total) by mouth 2 (two) times daily with a meal. 60 capsule 3  . Pantoprazole Sodium (PROTONIX PO) Take by mouth daily.     No current facility-administered medications for  this visit.    Allergies as of 08/22/2014 - Review Complete 08/22/2014  Allergen Reaction Noted  . Augmentin [amoxicillin-pot clavulanate] Diarrhea and Nausea And Vomiting 11/06/2013  . Codeine Nausea And Vomiting 09/17/2011  . Adhesive [tape] Rash 11/30/2011    Family History  Problem Relation Age of Onset  . Anesthesia problems Neg Hx   . Hypotension Neg Hx   . Malignant hyperthermia Neg Hx   . Pseudochol deficiency Neg Hx   . Heart disease Mother   . Diabetes Maternal Grandmother   . Stroke Paternal Grandmother   . Heart disease Paternal Grandfather     History   Social History  . Marital Status: Married     Spouse Name: N/A    Number of Children: 1  . 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 Smoking status: Never Smoker   . Smokeless tobacco: Never Used  . Alcohol Use: No  . Drug Use: No  . Sexual Activity:    Partners: Male    Birth Control/ Protection: Surgical     Comment: spouse   Other Topics Concern  . None   Social History Narrative   Married.  Works at [employer].  One daughter born [place]: As mentioned in HPI  Physical Exam: BP 145/72 mmHg  Pulse 60  Temp(Src) 97.5 F (36.4 C) (Oral)  Ht 5\' 6"  (1.676 m)  Wt 161 lb (73.029 kg)  BMI 26.00 kg/m2 General:   Alert and oriented. No distress noted. Pleasant and cooperative.  Abdomen:  +BS, soft, non-tender and non-distended. No rebound or guarding. No HSM or masses noted. Msk:  Symmetrical without gross deformities. Normal posture. Extremities:  Without edema. Neurologic:  Alert and  oriented x4;  grossly normal neurologically. Psych:  Alert and cooperative. Normal mood and affect.

## 2014-08-22 NOTE — Assessment & Plan Note (Signed)
Failure of multiple PPIs. Protonix currently. Add Zantac prn in the evening and continue dietary measures. 3 months follow-up. Avoid alka seltzer.

## 2014-08-22 NOTE — Progress Notes (Signed)
cc'ed to pcp °

## 2014-08-22 NOTE — Assessment & Plan Note (Signed)
Constipation-predominant. Failure to Linzess. Good results with Amitiza 24 mcg ONCE daily with Miralax titrated. Due to persistent symptoms, will proceed with HBT as previously recommended. Return in 3 months.

## 2014-08-22 NOTE — Patient Instructions (Signed)
We have scheduled you for a breath test to assess for bacterial overgrowth.   Continue Amitiza once daily WITH FOOD as you are doing, titrating Miralax once to twice a day to achieve good results.   Continue Protonix first thing in the morning on an empty stomach, 30 minutes before breakfast. You may take Zantac in the evening. Continue the dietary measures you are doing.   We will see you back in 3 months!

## 2014-09-02 ENCOUNTER — Encounter (HOSPITAL_COMMUNITY): Payer: Self-pay | Admitting: *Deleted

## 2014-09-02 ENCOUNTER — Ambulatory Visit (HOSPITAL_COMMUNITY)
Admission: RE | Admit: 2014-09-02 | Discharge: 2014-09-02 | Disposition: A | Discharge status: Home / Self Care | Payer: BC Managed Care – PPO | Source: Ambulatory Visit | Attending: Gastroenterology | Admitting: Gastroenterology

## 2014-09-02 ENCOUNTER — Encounter (HOSPITAL_COMMUNITY)
Admission: RE | Disposition: A | Discharge status: Home / Self Care | Payer: Self-pay | Source: Ambulatory Visit | Attending: Gastroenterology

## 2014-09-02 DIAGNOSIS — R143 Flatulence: Secondary | ICD-10-CM | POA: Insufficient documentation

## 2014-09-02 DIAGNOSIS — R109 Unspecified abdominal pain: Secondary | ICD-10-CM | POA: Insufficient documentation

## 2014-09-02 DIAGNOSIS — K589 Irritable bowel syndrome without diarrhea: Secondary | ICD-10-CM

## 2014-09-02 HISTORY — DX: Other specified diseases of intestine: K63.89

## 2014-09-02 HISTORY — PX: BACTERIAL OVERGROWTH TEST: SHX5739

## 2014-09-02 SURGERY — BREATH TEST, FOR INTESTINAL BACTERIAL OVERGROWTH

## 2014-09-02 MED ORDER — LACTULOSE 10 GM/15ML PO SOLN
30.0000 g | Freq: Once | ORAL | Status: AC
  Administered 2014-09-02: 30 g via ORAL

## 2014-09-02 MED ORDER — LACTULOSE 10 GM/15ML PO SOLN
ORAL | Status: AC
  Filled 2014-09-02: qty 60

## 2014-09-02 NOTE — Progress Notes (Addendum)
No beans, bran or high fiber cereal the day before the procedure? no NPO except for water 12 hours before procedure? no No smoking, sleeping or vigorous exercising for at least 30 before procedure? no Recent antibiotic use and/or diarrhea? Diarrhea yesterday   If yes, physician notified.  Time Baseline  30 mins 45 mins 60 mins 75 mins 90 mins 105 mins 120 mins 135 mins 150 mins 165 mins 180 mins  H2-ppm     2      4    6   15      34    62    67  69  72   55   71   29      Reviewed hydrogen breath test. Baseline of 2. AT 90 minutes, she is 34, which is more than 20 ppm increase from baseline, indicated small intestinal bacterial overgrowth.   Will proceed with Xifaxan 550 mg po TID X 2 weeks if covered by insurance. I have sent this to her pharmacy.   Orvil Feil, ANP-BC [facility]

## 2014-09-03 ENCOUNTER — Encounter (HOSPITAL_COMMUNITY): Payer: Self-pay | Admitting: Gastroenterology

## 2014-09-06 ENCOUNTER — Other Ambulatory Visit: Payer: Self-pay | Admitting: Gastroenterology

## 2014-09-06 ENCOUNTER — Encounter: Payer: Self-pay | Admitting: Gastroenterology

## 2014-09-06 MED ORDER — RIFAXIMIN 550 MG PO TABS: 550.0000 mg | ORAL_TABLET | Freq: Three times a day (TID) | ORAL | Status: DC

## 2014-09-06 NOTE — Progress Notes (Signed)
Called and informed pt.  

## 2014-09-06 NOTE — Progress Notes (Unsigned)
Small intestinal bacterial overgrowth. I have sent Xifaxan to pharmacy.

## 2014-09-16 ENCOUNTER — Encounter (HOSPITAL_COMMUNITY): Payer: Self-pay | Admitting: Gastroenterology

## 2014-09-16 NOTE — Op Note (Signed)
No beans, bran or high fiber cereal the day before the procedure? no NPO except for water 12 hours before procedure? no No smoking, sleeping or vigorous exercising for at least 30 before procedure? no Recent antibiotic use and/or diarrhea? Diarrhea yesterday   If yes, physician notified.  Time Baseline  30 mins 45 mins 60 mins 75 mins 90 mins 105 mins 120 mins 135 mins 150 mins 165 mins 180 mins  H2-ppm     2      4    6   15      34    62    67  69  72   55   71   29      Reviewed hydrogen breath test. Baseline of 2. AT 90 minutes, she is 66, which is more than 20 ppm increase from baseline, indicated small intestinal bacterial overgrowth.   Will proceed with Xifaxan 550 mg po TID X 2 weeks if covered by insurance. I have sent this to her pharmacy.   [name], ANP-BC [facility]   REVIEWED. AGREE. Consider CIPRO/FLAGYL if Sx do not improve with Xifaxan.

## 2014-10-21 ENCOUNTER — Other Ambulatory Visit: Payer: Self-pay | Admitting: Obstetrics and Gynecology

## 2014-10-22 LAB — CYTOLOGY - PAP

## 2014-11-01 ENCOUNTER — Encounter (HOSPITAL_COMMUNITY): Payer: Self-pay | Admitting: *Deleted

## 2014-11-01 ENCOUNTER — Emergency Department (HOSPITAL_COMMUNITY): Payer: BLUE CROSS/BLUE SHIELD

## 2014-11-01 ENCOUNTER — Emergency Department (HOSPITAL_COMMUNITY)
Admission: EM | Admit: 2014-11-01 | Discharge: 2014-11-01 | Disposition: A | Discharge status: Home / Self Care | Payer: BLUE CROSS/BLUE SHIELD | Attending: Emergency Medicine | Admitting: Emergency Medicine

## 2014-11-01 DIAGNOSIS — R05 Cough: Secondary | ICD-10-CM

## 2014-11-01 DIAGNOSIS — G43909 Migraine, unspecified, not intractable, without status migrainosus: Secondary | ICD-10-CM | POA: Insufficient documentation

## 2014-11-01 DIAGNOSIS — K5900 Constipation, unspecified: Secondary | ICD-10-CM | POA: Diagnosis not present

## 2014-11-01 DIAGNOSIS — Z7982 Long term (current) use of aspirin: Secondary | ICD-10-CM | POA: Insufficient documentation

## 2014-11-01 DIAGNOSIS — Z79899 Other long term (current) drug therapy: Secondary | ICD-10-CM | POA: Insufficient documentation

## 2014-11-01 DIAGNOSIS — J0100 Acute maxillary sinusitis, unspecified: Secondary | ICD-10-CM | POA: Diagnosis not present

## 2014-11-01 DIAGNOSIS — H409 Unspecified glaucoma: Secondary | ICD-10-CM | POA: Insufficient documentation

## 2014-11-01 DIAGNOSIS — H9209 Otalgia, unspecified ear: Secondary | ICD-10-CM | POA: Insufficient documentation

## 2014-11-01 DIAGNOSIS — J40 Bronchitis, not specified as acute or chronic: Secondary | ICD-10-CM

## 2014-11-01 DIAGNOSIS — R059 Cough, unspecified: Secondary | ICD-10-CM

## 2014-11-01 DIAGNOSIS — K219 Gastro-esophageal reflux disease without esophagitis: Secondary | ICD-10-CM | POA: Insufficient documentation

## 2014-11-01 DIAGNOSIS — J209 Acute bronchitis, unspecified: Secondary | ICD-10-CM | POA: Insufficient documentation

## 2014-11-01 DIAGNOSIS — Z8679 Personal history of other diseases of the circulatory system: Secondary | ICD-10-CM | POA: Diagnosis not present

## 2014-11-01 MED ORDER — PREDNISONE 20 MG PO TABS
40.0000 mg | ORAL_TABLET | Freq: Once | ORAL | Status: AC
  Administered 2014-11-01: 40 mg via ORAL
  Filled 2014-11-01: qty 2

## 2014-11-01 MED ORDER — IPRATROPIUM-ALBUTEROL 0.5-2.5 (3) MG/3ML IN SOLN
3.0000 mL | Freq: Once | RESPIRATORY_TRACT | Status: AC
  Administered 2014-11-01: 3 mL via RESPIRATORY_TRACT
  Filled 2014-11-01: qty 3

## 2014-11-01 MED ORDER — PHENYLEPH-PROMETHAZINE-COD 5-6.25-10 MG/5ML PO SYRP: 5.0000 mL | ORAL_SOLUTION | Freq: Two times a day (BID) | ORAL | Status: DC | PRN

## 2014-11-01 MED ORDER — AZITHROMYCIN 250 MG PO TABS: 250.0000 mg | ORAL_TABLET | Freq: Every day | ORAL | Status: DC

## 2014-11-01 MED ORDER — PREDNISONE 10 MG PO TABS: ORAL_TABLET | ORAL | Status: DC

## 2014-11-01 NOTE — ED Provider Notes (Signed)
CSN: [id number]     Arrival date & time 11/01/14  4315 History   First MD Initiated Contact with Patient 11/01/14 0840     Chief Complaint  Patient presents with  . URI     (Consider location/radiation/quality/duration/timing/severity/associated sxs/prior Treatment) Patient is a 49 y.o. female presenting with URI. The history is provided by the patient.  URI Presenting symptoms: congestion, cough, ear pain and sore throat   Severity:  Moderate Onset quality:  Gradual Duration:  1 month Progression:  Worsening Chronicity:  New Relieved by:  Nothing Worsened by:  Nothing tried Ineffective treatments:  OTC medications Associated symptoms: sinus pain and wheezing    [name] is a 49 y.o. female who presents to the ED with cough, congestin, sinus pain and wheezing. She states that about a month ago she started with runny nose and cough. She took OTC medication and it got better then a week or so later came back. This past week she developed wheezing and feeling thigh in her chest. She also developed ear pain and congestion.   Past Medical History  Diagnosis Date  . Glaucoma   . Chronic constipation   . GERD (gastroesophageal reflux disease)   . Hemorrhoids, internal, with bleeding DEC 2010 TCS  . Migraines   . Aneurysm 2009 COILING    stent placed  . Abdominal pain OCT 2011 NL CBC, HFP, LIPASE    MRCP-SEPTATED GB, NL CBD, R LOBE HEMANGIOMA  . Dysrhythmia     "irregular heartbeat"  . PONV (postoperative nausea and vomiting)   . Small intestinal bacterial overgrowth NOV 2015    HBT PEAK 72(135 MINS) TROUGH 55 (150 MINS), PEAK 71(165 MINS) TROUGH 29 (180 MINS)   Past Surgical History  Procedure Laterality Date  . Glaucoma surgery  MAR 2012  . Colonoscopy      DEC 2010 IH  . Tubal ligation    . Upper gastrointestinal endoscopy      OCT 2011 RING DILATED TO 16MM, CANDIDA ESOPHAGITIS, CHRONIC GASTRITIS  . Anuerysm repair      coil  . Esophagogastroduodenoscopy   09/17/2011    Dr. Oneida Alar :moderate gastritis  . Appendectomy    . Eye surgery      glaucoma-unsure if both or just one of them  . Cholecystectomy  12/06/2011    Procedure: LAPAROSCOPIC CHOLECYSTECTOMY;  Surgeon: Donato Heinz, MD;  Location: [facility];  Service: General;  Laterality: N/A;  . Esophagogastroduodenoscopy N/A 11/23/2013    Dr. Theophilus Bones Gastritis in the body and the antrum of the stomach/ SINGLE diverticulum in the second portion of the duodenum, mild chronic gastritis on path. Negative H.pylori. Pursue HBT if persistent issues.   . Bacterial overgrowth test N/A 09/02/2014   Family History  Problem Relation Age of Onset  . Anesthesia problems Neg Hx   . Hypotension Neg Hx   . Malignant hyperthermia Neg Hx   . Pseudochol deficiency Neg Hx   . Heart disease Mother   . Diabetes Maternal Grandmother   . Stroke Paternal Grandmother   . Heart disease Paternal Grandfather    History  Substance Use Topics  . Smoking status: Never Smoker   . Smokeless tobacco: Never Used  . Alcohol Use: No   OB History    No data available     Review of Systems  HENT: Positive for congestion, ear pain and sore throat.   Respiratory: Positive for cough and wheezing.   all other systems negative    Allergies  Augmentin; Codeine; and Adhesive  Home Medications   Prior to Admission medications   Medication Sig Start Date End Date Taking? Authorizing Provider  amitriptyline (ELAVIL) 75 MG tablet Take 75 mg by mouth at bedtime.   Yes Historical Provider, MD  aspirin EC 81 MG tablet Take 81 mg by mouth every other day.   Yes Historical Provider, MD  brimonidine (ALPHAGAN) 0.2 % ophthalmic solution Place 1 drop into the left eye 3 (three) times daily.    Yes Historical Provider, MD  dorzolamide-timolol (COSOPT) 22.3-6.8 MG/ML ophthalmic solution Place 1 drop into the left eye 2 (two) times daily.     Yes Historical Provider, MD  lubiprostone (AMITIZA) 24 MCG capsule Take 1 capsule (24  mcg total) by mouth 2 (two) times daily with a meal. Patient taking differently: Take 24 mcg by mouth daily with breakfast.  04/25/14  Yes Orvil Feil, NP  Multiple Vitamin (MULTIVITAMIN) tablet Take 1 tablet by mouth daily.   Yes Historical Provider, MD  pantoprazole (PROTONIX) 40 MG tablet Take 40 mg by mouth daily.   Yes Historical Provider, MD  polyethylene glycol (MIRALAX / GLYCOLAX) packet Take 17 g by mouth daily.   Yes Historical Provider, MD  Probiotic Product (PROBIOTIC DAILY) CAPS Take 1 capsule by mouth daily.   Yes Historical Provider, MD  azithromycin (ZITHROMAX) 250 MG tablet Take 1 tablet (250 mg total) by mouth daily. Take first 2 tablets together, then 1 every day until finished. 11/01/14   [place], NP  Phenyleph-Promethazine-Cod 5-6.25-10 MG/5ML SYRP Take 5 mLs by mouth every 12 (twelve) hours as needed. 11/01/14   Amrie Gurganus Bunnie Pion, NP  predniSONE (DELTASONE) 10 MG tablet Starting 11/02/14 take 2 tablets twice a day. 11/01/14   Lyndee Herbst Bunnie Pion, NP  rifaximin (XIFAXAN) 550 MG TABS tablet Take 1 tablet (550 mg total) by mouth 3 (three) times daily. Patient not taking: Reported on 11/01/2014 09/06/14   Orvil Feil, NP   BP 154/77 mmHg  Pulse 70  Temp(Src) 97.9 F (36.6 C) (Oral)  Resp 18  Ht 5\' 6"  (1.676 m)  Wt 165 lb (74.844 kg)  BMI 26.64 kg/m2  SpO2 100%  LMP 08/27/2014 Physical Exam  Constitutional: She is oriented to person, place, and time. She appears well-developed and well-nourished. No distress.  HENT:  Head: Normocephalic.  Right Ear: Tympanic membrane normal.  Left Ear: Tympanic membrane normal.  Nose: Mucosal edema and rhinorrhea present. Right sinus exhibits maxillary sinus tenderness. Left sinus exhibits maxillary sinus tenderness.  Mouth/Throat: Uvula is midline and mucous membranes are normal. Posterior oropharyngeal erythema present.  Eyes: Conjunctivae and EOM are normal. Pupils are equal, round, and reactive to light.  Neck: Normal range of motion. Neck  supple.  Cardiovascular: Normal rate and regular rhythm.   Pulmonary/Chest: Effort normal. She has wheezes.  Abdominal: Soft. There is no tenderness.  Musculoskeletal: Normal range of motion.  Lymphadenopathy:    She has no cervical adenopathy.  Neurological: She is alert and oriented to person, place, and time. No cranial nerve deficit.  Skin: Skin is warm and dry.  Psychiatric: She has a normal mood and affect. Her behavior is normal.  Nursing note and vitals reviewed.   ED Course  Procedures (including critical care time) Albuterol/Atrovent neb treatment, prednisone, Tussionex Patient improved with treatment. Labs Review Labs Reviewed - No data to display  Imaging Review Dg Chest 2 View  11/01/2014   CLINICAL DATA:  Cough off and on for 1 month.  Initial  encounter.  EXAM: CHEST  2 VIEW  COMPARISON:  None.  FINDINGS: Two view exam shows low volumes without focal airspace consolidation, pulmonary edema, or pleural effusion. Subsegmental atelectasis noted in the lower lobes bilaterally. Cardiopericardial silhouette is at upper limits of normal for size. Imaged bony structures of the thorax are intact.  IMPRESSION: Low volume film with basilar atelectasis.   Electronically Signed   By: Misty Stanley M.D.   On: 11/01/2014 11:21     MDM  49 y.o. female with cough and wheezing, sore throat, sinus congestion and ear pain. Will treat for bronchitis and she will follow up with her PCP or return here as needed for worsening symptoms. Stable for discharge without respiratory distress. O2 SAT 100% on R/A. Discussed with the patient and all questioned fully answered.   Final diagnoses:  Cough  Bronchitis  Acute maxillary sinusitis, recurrence not specified      Ashley Murrain, NP 11/01/14 [place], MD 11/04/14 [phone]

## 2014-11-01 NOTE — ED Notes (Signed)
Pt co upper respiratory infection, congestion, sore throat, and cough x1 month, symptoms getting worse per pt.

## 2014-11-15 ENCOUNTER — Encounter: Payer: Self-pay | Admitting: Gastroenterology

## 2014-12-24 ENCOUNTER — Other Ambulatory Visit: Payer: Self-pay

## 2014-12-26 MED ORDER — LUBIPROSTONE 24 MCG PO CAPS: 24.0000 ug | ORAL_CAPSULE | Freq: Every day | ORAL | Status: DC

## 2014-12-26 NOTE — Telephone Encounter (Signed)
Reduced frequency due to nausea side effects per patient request.

## 2015-01-01 ENCOUNTER — Encounter: Payer: Self-pay | Admitting: Gastroenterology

## 2015-01-30 ENCOUNTER — Ambulatory Visit (INDEPENDENT_AMBULATORY_CARE_PROVIDER_SITE_OTHER): Payer: BLUE CROSS/BLUE SHIELD | Admitting: Gastroenterology

## 2015-01-30 ENCOUNTER — Encounter: Payer: Self-pay | Admitting: Gastroenterology

## 2015-01-30 VITALS — BP 133/77 | HR 66 | Temp 97.5°F | Ht 66.0 in | Wt 166.4 lb

## 2015-01-30 DIAGNOSIS — K5900 Constipation, unspecified: Secondary | ICD-10-CM

## 2015-01-30 DIAGNOSIS — K6389 Other specified diseases of intestine: Secondary | ICD-10-CM

## 2015-01-30 DIAGNOSIS — K219 Gastro-esophageal reflux disease without esophagitis: Secondary | ICD-10-CM

## 2015-01-30 MED ORDER — PANTOPRAZOLE SODIUM 40 MG PO TBEC: 40.0000 mg | DELAYED_RELEASE_TABLET | Freq: Every day | ORAL | Status: DC

## 2015-01-30 NOTE — Assessment & Plan Note (Signed)
Doing well with Amitiza 24 mcg once daily dosing in the evening. Continue Amitiza and probiotic. Return in 1 year.

## 2015-01-30 NOTE — Patient Instructions (Signed)
I have refilled your Protonix for a year.   Continue Amitiza each evening. Let me know if you find out the name of the probiotic!  Call me with any concerns in the meantime, otherwise we will see you back in 1 year!

## 2015-01-30 NOTE — Progress Notes (Signed)
Referring Provider: Asencion Noble, MD Primary Care Physician:  Asencion Noble, MD  Primary GI: Dr. Oneida Alar   Chief Complaint  Patient presents with  . Follow-up    HPI:   [name] is a 49 y.o. female presenting today with a history of chronic abdominal pain, constipation, gastritis. 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 Clinically, did not feel to be dealing with this. EGD with mild chronic gastritis, single duodenal diverticulum. Recommend HBT if persistent issues.Underwent HBT in Nov 2015, which was positive for bacterial overgrowth. Course of Xifaxan provided.    Xifaxan improved symptoms. Not having any problems. Taking a new probiotic but can't remember the name. Takes Amitiza 24 mcg each evening and doing well. Not having to take Miralax. Occasional reflux. Zantac only as needed. Trying not to eat late.   Past Medical History  Diagnosis Date  . Glaucoma   . Chronic constipation   . GERD (gastroesophageal reflux disease)   . Hemorrhoids, internal, with bleeding DEC 2010 TCS  . Migraines   . Aneurysm 2009 COILING    stent placed  . Abdominal pain OCT 2011 NL CBC, HFP, LIPASE    MRCP-SEPTATED GB, NL CBD, R LOBE HEMANGIOMA  . Dysrhythmia     "irregular heartbeat"  . PONV (postoperative nausea and vomiting)   . 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 Glaucoma surgery  MAR 2012  . Colonoscopy      DEC 2010 IH  . Tubal ligation    . Upper gastrointestinal endoscopy      OCT 2011 RING DILATED TO 16MM, CANDIDA ESOPHAGITIS, CHRONIC GASTRITIS  . Anuerysm repair      coil  . Esophagogastroduodenoscopy  09/17/2011    Dr. Oneida Alar :moderate gastritis  . Appendectomy    . Eye surgery      glaucoma-unsure if both or just one of them  . Cholecystectomy  12/06/2011    Procedure: LAPAROSCOPIC CHOLECYSTECTOMY;   Surgeon: Donato Heinz, MD;  Location: [facility];  Service: General;  Laterality: N/A;  . Esophagogastroduodenoscopy N/A 11/23/2013    Dr. Theophilus Bones Gastritis in the body and the antrum of the stomach/ SINGLE diverticulum in the second portion of the duodenum, mild chronic gastritis on path. Negative H.pylori. Pursue HBT if persistent issues.   . Bacterial overgrowth test N/A 09/02/2014    POSITIVE    Current Outpatient Prescriptions  Medication Sig Dispense Refill  . amitriptyline (ELAVIL) 75 MG tablet Take 75 mg by mouth at bedtime.    Marland Kitchen aspirin EC 81 MG tablet Take 81 mg by mouth every other day.    Marland Kitchen azithromycin (ZITHROMAX) 250 MG tablet Take 1 tablet (250 mg total) by mouth daily. Take first 2 tablets together, then 1 every day until finished. 6 tablet 0  . brimonidine (ALPHAGAN) 0.2 % ophthalmic solution Place 1 drop into the left eye 3 (three) times daily.     . dorzolamide-timolol (COSOPT) 22.3-6.8 MG/ML ophthalmic solution Place 1 drop into the left eye 2 (two) times daily.      Marland Kitchen lubiprostone (AMITIZA) 24 MCG capsule Take 1 capsule (24 mcg total) by mouth daily with breakfast. 60 capsule 3  . Multiple Vitamin (MULTIVITAMIN) tablet Take 1 tablet by mouth daily.    . pantoprazole (PROTONIX) 40 MG tablet Take 40 mg by mouth daily.    Marland Kitchen  Phenyleph-Promethazine-Cod 5-6.25-10 MG/5ML SYRP Take 5 mLs by mouth every 12 (twelve) hours as needed. 118 mL 0  . polyethylene glycol (MIRALAX / GLYCOLAX) packet Take 17 g by mouth daily.    . predniSONE (DELTASONE) 10 MG tablet Starting 11/02/14 take 2 tablets twice a day. 14 tablet 0  . Probiotic Product (PROBIOTIC DAILY) CAPS Take 1 capsule by mouth daily.    . rifaximin (XIFAXAN) 550 MG TABS tablet Take 1 tablet (550 mg total) by mouth 3 (three) times daily. 42 tablet 0   No current facility-administered medications for this visit.    Allergies as of 01/30/2015 - Review Complete 01/30/2015  Allergen Reaction Noted  . Augmentin  [amoxicillin-pot clavulanate] Diarrhea and Nausea And Vomiting 11/06/2013  . Codeine Nausea And Vomiting 09/17/2011  . Adhesive [tape] Rash 11/30/2011    Family History  Problem Relation Age of Onset  . Anesthesia problems Neg Hx   . Hypotension Neg Hx   . Malignant hyperthermia Neg Hx   . Pseudochol deficiency Neg Hx   . Heart disease Mother   . Diabetes Maternal Grandmother   . Stroke Paternal Grandmother   . Heart disease Paternal Grandfather     History   Social History  . Marital Status: Married    Spouse Name: N/A  . Number of Children: 1  . 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 Smoking status: Never Smoker   . Smokeless tobacco: Never Used  . Alcohol Use: No  . Drug Use: No  . Sexual Activity:    Partners: Male    Birth Control/ Protection: Surgical     Comment: spouse   Other Topics Concern  . None   Social History Narrative   Married.  Works at [employer].  One daughter born [place]: Negative unless mentioned in HPI  Physical Exam: BP 133/77 mmHg  Pulse 66  Temp(Src) 97.5 F (36.4 C)  Ht 5\' 6"  (1.676 m)  Wt 166 lb 6.4 oz (75.479 kg)  BMI 26.87 kg/m2  LMP 01/06/2015 General:   Alert and oriented. No distress noted. Pleasant and cooperative.  Head:  Normocephalic and atraumatic. Eyes:  Conjuctiva clear without scleral icterus. Mouth:  Oral mucosa pink and moist. Good dentition. No lesions. Abdomen:  +BS, soft, non-tender and non-distended. No rebound or guarding. No HSM or masses noted. Msk:  Symmetrical without gross deformities. Normal posture. Extremities:  Without edema. Neurologic:  Alert and  oriented x4;  grossly normal neurologically. Skin:  Intact without significant lesions or rashes. Psych:  Alert and cooperative. Normal mood and affect.

## 2015-01-30 NOTE — Progress Notes (Signed)
CC'ED TO PCP 

## 2015-01-30 NOTE — Assessment & Plan Note (Signed)
Continue Protonix once each morning. Refills provided for 1 year.

## 2015-01-30 NOTE — Assessment & Plan Note (Signed)
Positive HBT in Nov 2015. Excellent response to Xifaxan. Patient to call if any recurrent symptoms and will provide course of Cipro/Flagyl. Otherwise, return in 1 year.

## 2015-02-03 ENCOUNTER — Other Ambulatory Visit: Payer: Self-pay

## 2015-02-03 MED ORDER — PANTOPRAZOLE SODIUM 40 MG PO TBEC: 40.0000 mg | DELAYED_RELEASE_TABLET | Freq: Every day | ORAL | Status: DC

## 2015-03-13 ENCOUNTER — Telehealth (HOSPITAL_COMMUNITY): Payer: Self-pay | Admitting: Interventional Radiology

## 2015-03-13 NOTE — Telephone Encounter (Signed)
Called pt, left VM for her to call back concerning issues with her insurance company in getting approval for MRI/MRA f/u that is due. JM

## 2015-04-18 ENCOUNTER — Telehealth: Payer: Self-pay | Admitting: *Deleted

## 2015-04-18 MED ORDER — METRONIDAZOLE 500 MG PO TABS: 500.0000 mg | ORAL_TABLET | Freq: Three times a day (TID) | ORAL | Status: DC

## 2015-04-18 MED ORDER — CIPROFLOXACIN HCL 500 MG PO TABS: 500.0000 mg | ORAL_TABLET | Freq: Two times a day (BID) | ORAL | Status: DC

## 2015-04-18 NOTE — Telephone Encounter (Signed)
Tried to call pt, and could not leave a VM.  

## 2015-04-18 NOTE — Telephone Encounter (Signed)
I called pt and she said she is having the abdominal pain as she did before. It is a couple of inches above her belly button, and the pain is constant x 1 week. She is not having any nausea or vomiting. She said she is constipated a little sometimes, but most of the time she has a BM daily and she had a normal one this AM. Please advise!

## 2015-04-18 NOTE — Telephone Encounter (Signed)
I have sent in Cipro and Flagyl for 10 days.

## 2015-04-18 NOTE — Telephone Encounter (Signed)
Pt called stating [name] would give her another RX for her infection if it came back again, pt said it has been bothering her for over a week now, pt wants the RX to go to [facility] in [place]. Please advise [phone], pt stated it is a medicine for her infection that she seen [name] last for.

## 2015-04-22 NOTE — Telephone Encounter (Signed)
PT is aware.

## 2015-04-23 ENCOUNTER — Other Ambulatory Visit: Payer: Self-pay | Admitting: Obstetrics and Gynecology

## 2015-04-25 LAB — CYTOLOGY - PAP

## 2015-05-08 ENCOUNTER — Telehealth: Payer: Self-pay

## 2015-05-08 NOTE — Telephone Encounter (Signed)
Pt called this afternoon saying she had talked with DS this morning and she's having problems with her phone and was checking to see if we had tried calling. I told her that DS left early today and would be back on Monday and I would put a note in that she had called and added her cellphone number [phone].  Please call her on that number

## 2015-05-08 NOTE — Telephone Encounter (Signed)
Pt called and said she only took the Cipro and Flagyl 3 and a half days. She got diarrhea so bad, about 15 times a day for a day or so. She has not taken it since Tues.  She did not have any diarrhea yesterday, no BM. Today she has had a couple of loose stools. She just feels so badly and doesn't know if she should try to finish the meds. She has not been able to eat much. Please advise!

## 2015-05-09 NOTE — Telephone Encounter (Signed)
Personally spoke with patient. Feeling better now. Thinks she may have gotten dehydrated. Diarrhea improved/nearing baseline. I believe her diarrhea was related to antibiotics that she took. IF IT PERSISTS, NEEDS CDIFF. However, symptoms resolved after stopping abx.   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 She has done better with Xifaxan in past. Patient to call over weekend if worsening of symptoms. I asked her to call Monday with an update.   Orvil Feil, ANP-BC [facility]

## 2015-05-12 NOTE — Telephone Encounter (Signed)
See Anna's note of 05/09/2015.

## 2015-05-18 NOTE — Progress Notes (Signed)
REVIEWED-NO ADDITIONAL RECOMMENDATIONS. 

## 2015-08-20 DIAGNOSIS — H269 Unspecified cataract: Secondary | ICD-10-CM | POA: Insufficient documentation

## 2015-08-20 DIAGNOSIS — H40039 Anatomical narrow angle, unspecified eye: Secondary | ICD-10-CM | POA: Insufficient documentation

## 2015-08-20 DIAGNOSIS — H2513 Age-related nuclear cataract, bilateral: Secondary | ICD-10-CM | POA: Insufficient documentation

## 2015-08-20 DIAGNOSIS — H402290 Chronic angle-closure glaucoma, unspecified eye, stage unspecified: Secondary | ICD-10-CM | POA: Insufficient documentation

## 2015-08-20 DIAGNOSIS — Q112 Microphthalmos: Secondary | ICD-10-CM | POA: Insufficient documentation

## 2015-09-01 ENCOUNTER — Other Ambulatory Visit: Payer: Self-pay | Admitting: Nurse Practitioner

## 2015-09-16 ENCOUNTER — Telehealth (HOSPITAL_COMMUNITY): Payer: Self-pay

## 2015-09-16 NOTE — Telephone Encounter (Signed)
Called to schedule f/u exam with Dr. Estanislado Pandy, left a message for pt to call back. AW

## 2015-10-09 ENCOUNTER — Emergency Department (HOSPITAL_COMMUNITY)
Admission: EM | Admit: 2015-10-09 | Discharge: 2015-10-09 | Disposition: A | Discharge status: Home / Self Care | Payer: BLUE CROSS/BLUE SHIELD | Attending: Emergency Medicine | Admitting: Emergency Medicine

## 2015-10-09 ENCOUNTER — Encounter (HOSPITAL_COMMUNITY): Payer: Self-pay

## 2015-10-09 ENCOUNTER — Emergency Department (HOSPITAL_COMMUNITY): Payer: BLUE CROSS/BLUE SHIELD

## 2015-10-09 DIAGNOSIS — Z8679 Personal history of other diseases of the circulatory system: Secondary | ICD-10-CM | POA: Diagnosis not present

## 2015-10-09 DIAGNOSIS — R079 Chest pain, unspecified: Secondary | ICD-10-CM

## 2015-10-09 DIAGNOSIS — Z7982 Long term (current) use of aspirin: Secondary | ICD-10-CM | POA: Insufficient documentation

## 2015-10-09 DIAGNOSIS — Z8719 Personal history of other diseases of the digestive system: Secondary | ICD-10-CM | POA: Insufficient documentation

## 2015-10-09 DIAGNOSIS — H409 Unspecified glaucoma: Secondary | ICD-10-CM | POA: Diagnosis not present

## 2015-10-09 DIAGNOSIS — G43909 Migraine, unspecified, not intractable, without status migrainosus: Secondary | ICD-10-CM | POA: Insufficient documentation

## 2015-10-09 DIAGNOSIS — Z79899 Other long term (current) drug therapy: Secondary | ICD-10-CM | POA: Insufficient documentation

## 2015-10-09 DIAGNOSIS — R0789 Other chest pain: Secondary | ICD-10-CM | POA: Insufficient documentation

## 2015-10-09 LAB — CBC
HCT: 40.9 % (ref 36.0–46.0)
Hemoglobin: 13.8 g/dL (ref 12.0–15.0)
MCH: 30.9 pg (ref 26.0–34.0)
MCHC: 33.7 g/dL (ref 30.0–36.0)
MCV: 91.5 fL (ref 78.0–100.0)
PLATELETS: 200 10*3/uL (ref 150–400)
RBC: 4.47 MIL/uL (ref 3.87–5.11)
RDW: 13.7 % (ref 11.5–15.5)
WBC: 6.2 10*3/uL (ref 4.0–10.5)

## 2015-10-09 LAB — I-STAT TROPONIN, ED
TROPONIN I, POC: 0 ng/mL (ref 0.00–0.08)
TROPONIN I, POC: 0.01 ng/mL (ref 0.00–0.08)

## 2015-10-09 LAB — BASIC METABOLIC PANEL
Anion gap: 8 (ref 5–15)
BUN: 8 mg/dL (ref 6–20)
CALCIUM: 9.3 mg/dL (ref 8.9–10.3)
CO2: 24 mmol/L (ref 22–32)
Chloride: 109 mmol/L (ref 101–111)
Creatinine, Ser: 0.74 mg/dL (ref 0.44–1.00)
GFR calc Af Amer: >60 mL/min (ref >60)
GFR calc non Af Amer: >60 mL/min (ref >60)
Glucose, Bld: 111 mg/dL — ABNORMAL HIGH (ref 65–99)
Potassium: 3.7 mmol/L (ref 3.5–5.1)
SODIUM: 141 mmol/L (ref 135–145)

## 2015-10-09 MED ORDER — GI COCKTAIL ~~LOC~~
30.0000 mL | Freq: Once | ORAL | Status: AC
  Administered 2015-10-09: 30 mL via ORAL
  Filled 2015-10-09: qty 30

## 2015-10-09 MED ORDER — ONDANSETRON HCL 4 MG/2ML IJ SOLN
4.0000 mg | Freq: Once | INTRAMUSCULAR | Status: AC
  Administered 2015-10-09: 4 mg via INTRAVENOUS
  Filled 2015-10-09: qty 2

## 2015-10-09 MED ORDER — ALUM & MAG HYDROXIDE-SIMETH 200-200-20 MG/5ML PO SUSP: 15.0000 mL | Freq: Once | ORAL | Status: DC

## 2015-10-09 MED ORDER — KETOROLAC TROMETHAMINE 30 MG/ML IJ SOLN
30.0000 mg | Freq: Once | INTRAMUSCULAR | Status: AC
  Administered 2015-10-09: 30 mg via INTRAVENOUS
  Filled 2015-10-09: qty 1

## 2015-10-09 MED ORDER — MORPHINE SULFATE (PF) 4 MG/ML IV SOLN
4.0000 mg | Freq: Once | INTRAVENOUS | Status: AC
  Administered 2015-10-09: 4 mg via INTRAVENOUS
  Filled 2015-10-09: qty 1

## 2015-10-09 MED ORDER — LIDOCAINE VISCOUS 2 % MT SOLN: 15.0000 mL | Freq: Once | OROMUCOSAL | Status: DC

## 2015-10-09 NOTE — ED Notes (Signed)
Dr. Jearld Pies at bedside

## 2015-10-09 NOTE — ED Notes (Addendum)
Pt c/o worsening L sided chest pain, repeat ekg completed and given to Dr. Venora Maples; pt clammy

## 2015-10-09 NOTE — ED Notes (Signed)
Pt given 324 ASA and 1 Nitro with no improvement to CP.

## 2015-10-09 NOTE — ED Notes (Signed)
Pt. Left with all belongings and refused wheelchair. Discharge instructions were reviewed and all questions were answered.  

## 2015-10-09 NOTE — ED Provider Notes (Signed)
CSN: [id number]     Arrival date & time 10/09/15  1841 History   First MD Initiated Contact with Patient 10/09/15 1844     Chief Complaint  Patient presents with  . Chest Pain     (Consider location/radiation/quality/duration/timing/severity/associated sxs/prior Treatment) Patient is a 49 y.o. female presenting with chest pain. The history is provided by the patient.  Chest Pain Pain location:  L chest Pain quality: sharp   Pain radiates to:  Does not radiate Pain radiates to the back: no   Pain severity:  Moderate Onset quality:  Gradual Duration:  2 hours Timing:  Constant Progression:  Unchanged Chronicity:  New Context: at rest   Relieved by:  Nitroglycerin and aspirin Worsened by:  Nothing tried Ineffective treatments:  None tried Associated symptoms: no abdominal pain, no AICD problem, no altered mental status, no anorexia, no anxiety, no back pain, no claudication, no cough, no diaphoresis, no dizziness, no dysphagia, no fatigue, no fever, no headache, no heartburn, no lower extremity edema, no nausea, no near-syncope, no numbness, no orthopnea, no palpitations, no shortness of breath, no syncope, not vomiting and no weakness   Risk factors: no coronary artery disease, no diabetes mellitus, no high cholesterol, no hypertension, not female, not obese, no prior DVT/PE and no smoking     Past Medical History  Diagnosis Date  . Glaucoma   . Chronic constipation   . GERD (gastroesophageal reflux disease)   . Hemorrhoids, internal, with bleeding DEC 2010 T[facility]  . Migraines   . Aneurysm ([place]) 2009 COILING    stent placed  . Abdominal pain OCT 2011 NL CBC, HFP, LIPASE    MRCP-SEPTATED GB, NL CBD, R LOBE HEMANGIOMA  . Dysrhythmia     "irregular heartbeat"  . PONV (postoperative nausea and vomiting)   . Small intestinal bacterial overgrowth NOV 2015    HBT PEAK 72(135 MINS) TROUGH 55 (150 MINS), PEAK 71(165 MINS) TROUGH 29 (180 MINS)   Past Surgical History  Procedure  Laterality Date  . Glaucoma surgery  MAR 2012  . Colonoscopy      DEC 2010 [facility]  . Tubal ligation    . Upper gastrointestinal endoscopy      OCT 2011 RING DILATED TO 16MM, CANDIDA ESOPHAGITIS, CHRONIC GASTRITIS  . Anuerysm repair      coil  . Esophagogastroduodenoscopy  09/17/2011    Dr. Oneida Alar :moderate gastritis  . Appendectomy    . Eye surgery      glaucoma-unsure if both or just one of them  . Cholecystectomy  12/06/2011    Procedure: LAPAROSCOPIC CHOLECYSTECTOMY;  Surgeon: Donato Heinz, MD;  Location: [facility];  Service: General;  Laterality: N/A;  . Esophagogastroduodenoscopy N/A 11/23/2013    Dr. Theophilus Bones Gastritis in the body and the antrum of the stomach/ SINGLE diverticulum in the second portion of the duodenum, mild chronic gastritis on path. Negative H.pylori. Pursue HBT if persistent issues.   . Bacterial overgrowth test N/A 09/02/2014    POSITIVE   Family History  Problem Relation Age of Onset  . Anesthesia problems Neg Hx   . Hypotension Neg Hx   . Malignant hyperthermia Neg Hx   . Pseudochol deficiency Neg Hx   . Heart disease Mother   . Diabetes Maternal Grandmother   . Stroke Paternal Grandmother   . Heart disease Paternal Grandfather    Social History  Substance Use Topics  . Smoking status: Never Smoker   . Smokeless tobacco: Never Used  . Alcohol Use: No  OB History    No data available     Review of Systems  Constitutional: Negative for fever, diaphoresis and fatigue.  HENT: Negative for trouble swallowing.   Eyes: Negative for pain.  Respiratory: Positive for chest tightness. Negative for cough and shortness of breath.   Cardiovascular: Positive for chest pain. Negative for palpitations, orthopnea, claudication, syncope and near-syncope.  Gastrointestinal: Negative for heartburn, nausea, vomiting, abdominal pain, diarrhea, constipation and anorexia.  Musculoskeletal: Negative for back pain.  Skin: Negative for rash.  Neurological: Negative  for dizziness, weakness, numbness and headaches.  Psychiatric/Behavioral: Negative for confusion.      Allergies  Augmentin; Codeine; and Adhesive  Home Medications   Prior to Admission medications   Medication Sig Start Date End Date Taking? Authorizing Provider  AMITIZA 24 MCG capsule TAKE ONE CAPSULE BY MOUTH ONCE DAILY WITH BREAKFAST 09/03/15  Yes Carlis Stable, NP  amitriptyline (ELAVIL) 75 MG tablet Take 75 mg by mouth at bedtime.   Yes Historical Provider, MD  aspirin EC 81 MG tablet Take 81 mg by mouth every other day.   Yes Historical Provider, MD  brimonidine (ALPHAGAN) 0.2 % ophthalmic solution Place 1 drop into the left eye 3 (three) times daily.    Yes Historical Provider, MD  dorzolamide-timolol (COSOPT) 22.3-6.8 MG/ML ophthalmic solution Place 1 drop into the left eye 2 (two) times daily.     Yes Historical Provider, MD  latanoprost (XALATAN) 0.005 % ophthalmic solution Place 1 drop into both eyes at bedtime.   Yes Historical Provider, MD  methazolamide (NEPTAZANE) 50 MG tablet Take 25-50 mg by mouth 3 (three) times daily. Take 25 mg in the morning and 50 mg in the afternoon/evening   Yes Historical Provider, MD  Multiple Vitamin (MULTIVITAMIN) tablet Take 1 tablet by mouth daily.   Yes Historical Provider, MD  Probiotic Product (PROBIOTIC DAILY) CAPS Take 1 capsule by mouth daily.   Yes Historical Provider, MD  azithromycin (ZITHROMAX) 250 MG tablet Take 1 tablet (250 mg total) by mouth daily. Take first 2 tablets together, then 1 every day until finished. Patient not taking: Reported on 10/09/2015 11/01/14   Ashley Murrain, NP  ciprofloxacin (CIPRO) 500 MG tablet Take 1 tablet (500 mg total) by mouth 2 (two) times daily. Patient not taking: Reported on 10/09/2015 04/18/15   Orvil Feil, NP  metroNIDAZOLE (FLAGYL) 500 MG tablet Take 1 tablet (500 mg total) by mouth 3 (three) times daily. Patient not taking: Reported on 10/09/2015 04/18/15   Orvil Feil, NP  pantoprazole (PROTONIX)  40 MG tablet Take 1 tablet (40 mg total) by mouth daily. Patient not taking: Reported on 10/09/2015 02/03/15   Orvil Feil, NP  Phenyleph-Promethazine-Cod 5-6.25-10 MG/5ML SYRP Take 5 mLs by mouth every 12 (twelve) hours as needed. Patient not taking: Reported on 10/09/2015 11/01/14   Ashley Murrain, NP  predniSONE (DELTASONE) 10 MG tablet Starting 11/02/14 take 2 tablets twice a day. Patient not taking: Reported on 10/09/2015 11/01/14   Ashley Murrain, NP  rifaximin (XIFAXAN) 550 MG TABS tablet Take 1 tablet (550 mg total) by mouth 3 (three) times daily. Patient not taking: Reported on 10/09/2015 09/06/14   Orvil Feil, NP   BP 169/91 mmHg  Pulse 64  Temp(Src) 98 F (36.7 C) (Oral)  Resp 14  Ht 5\' 6"  (1.676 m)  Wt 74.844 kg  BMI 26.64 kg/m2  SpO2 97%  LMP 08/09/2015 (Within Days) Physical Exam  Constitutional: She is oriented to person,  place, and time. She appears well-developed and well-nourished. No distress.  HENT:  Head: Normocephalic and atraumatic.  Eyes: Conjunctivae and EOM are normal. Pupils are equal, round, and reactive to light.  Neck: Normal range of motion. Neck supple.  Cardiovascular: Normal rate, regular rhythm and normal heart sounds.  Exam reveals no gallop and no friction rub.   No murmur heard. Pulmonary/Chest: Effort normal and breath sounds normal. No respiratory distress. She has no wheezes. She has no rales. She exhibits tenderness. She exhibits no edema, no deformity and no retraction.  Abdominal: Soft. Bowel sounds are normal. She exhibits no distension. There is no tenderness. There is no rebound and no guarding.  Musculoskeletal: Normal range of motion.  Neurological: She is alert and oriented to person, place, and time. No cranial nerve deficit.  Skin: Skin is warm and dry. She is not diaphoretic. No erythema.  Psychiatric: She has a normal mood and affect.    ED Course  Procedures (including critical care time) Labs Review Labs Reviewed  BASIC METABOLIC  PANEL - Abnormal; Notable for the following:    Glucose, Bld 111 (*)    All other components within normal limits  CBC  I-STAT TROPOININ, ED  Randolm Idol, ED    Imaging Review Dg Chest 2 View  10/09/2015  CLINICAL DATA:  Sudden onset of chest pain and shortness of breath. EXAM: CHEST  2 VIEW COMPARISON:  11/01/2014 FINDINGS: Cardiomediastinal silhouette is normal. Mediastinal contours appear intact. There is no evidence of focal airspace consolidation, pleural effusion or pneumothorax. Osseous structures are without acute abnormality. Soft tissues are grossly normal. IMPRESSION: No radiographic evidence of acute cardiopulmonary abnormality. Electronically Signed   By: Fidela Salisbury M.D.   On: 10/09/2015 19:34   I have personally reviewed and evaluated these images and lab results as part of my medical decision-making.   EKG Interpretation   Date/Time:  Thursday October 09 2015 21:45:44 EST Ventricular Rate:  66 PR Interval:  164 QRS Duration: 96 QT Interval:  445 QTC Calculation: 466 R Axis:   29 Text Interpretation:  Sinus rhythm No significant change was found  Confirmed by CAMPOS  MD, Lennette Bihari ([id number]) on 10/09/2015 9:59:51 PM      MDM   Final diagnoses:  Left sided chest pain    49 year old Caucasian female with no pertinent past medical history presents in setting of chest pain. Patient reports she was leaving work when she had a gradual onset of retrosternal chest pain which was sharp in nature. Pain was worsened with inspiration and palpation. She reports due to symptoms she presented to work nurse who advised her to come to the emergency department for further evaluation.  On arrival patient was hemodynamically stable and afebrile and EKG was obtained which revealed normal sinus rhythm without acute ST segment elevation or depression. No significant interval abnormalities and no significant T-wave abnormality. Patient denies any headaches, vision change, nausea,  diaphoresis, vomiting, Ola Spurr, constipation, diarrhea, fevers, rashes, recent trauma, history of blood clots, recent long travel, leg swelling, exogenous estrogen use. Patient was given aspirin and nitroglycerin by EMS with mild improvement in symptoms.  In setting of patient's complaints we'll obtain chest x-ray, CBC, troponin 2, BNP I will give patient Toradol and will reassess afterwards. Patient's pain was worsened with deep inspiration but did not have any shortness of breath. Patient is low risk per HEAR score.  Initial troponin was undetectable and chest x-ray did not reveal any acute cardiopulmonary abnormality. No significant electrolyte abnormality  and no elevation in white blood cell count. Creatinine within normal limits. Patient reported improvement in symptoms with pain medication.  Pt had repeat pain with no change in EKG.  . Repeat troponin continued to not be elevated. At this time this is unlikely to be cardiac chest pain. As patient is low risk heart score we'll discharge home at this time with plan to follow up with PCP for further management of symptoms. Patient and family given strict return precautions and patient stable at time of discharge. Patient and family in agreement with plan.  Attending seen and evaluated the patient Dr. Venora Maples is in agreement with plan.    Esaw Grandchild, MD 10/10/15 [id number]  Jola Schmidt, MD 10/10/15 [id number]

## 2015-10-09 NOTE — Discharge Instructions (Signed)
Chest Wall Pain Chest wall pain is pain in or around the bones and muscles of your chest. Sometimes, an injury causes this pain. Sometimes, the cause may not be known. This pain may take several weeks or longer to get better. HOME CARE Pay attention to any changes in your symptoms. Take these actions to help with your pain:  Rest as told by your doctor.  Avoid activities that cause pain. Try not to use your chest, belly (abdominal), or side muscles to lift heavy things.  If directed, apply ice to the painful area:  Put ice in a plastic bag.  Place a towel between your skin and the bag.  Leave the ice on for 20 minutes, 2-3 times per day.  Take over-the-counter and prescription medicines only as told by your doctor.  Do not use tobacco products, including cigarettes, chewing tobacco, and e-cigarettes. If you need help quitting, ask your doctor.  Keep all follow-up visits as told by your doctor. This is important. GET HELP IF:  You have a fever.  Your chest pain gets worse.  You have new symptoms. GET HELP RIGHT AWAY IF:  You feel sick to your stomach (nauseous) or you throw up (vomit).  You feel sweaty or light-headed.  You have a cough with phlegm (sputum) or you cough up blood.  You are short of breath.   This information is not intended to replace advice given to you by your health care provider. Make sure you discuss any questions you have with your health care provider.   Document Released: 03/22/2008 Document Revised: 06/25/2015 Document Reviewed: 12/30/2014 Elsevier Interactive Patient Education 2016 [employer]  Nonspecific Chest Pain  Chest pain can be caused by many different conditions. There is always a chance that your pain could be related to something serious, such as a heart attack or a blood clot in your lungs. Chest pain can also be caused by conditions that are not life-threatening. If you have chest pain, it is very important to follow up with your  health care provider. CAUSES  Chest pain can be caused by:  Heartburn.  Pneumonia or bronchitis.  Anxiety or stress.  Inflammation around your heart (pericarditis) or lung (pleuritis or pleurisy).  A blood clot in your lung.  A collapsed lung (pneumothorax). It can develop suddenly on its own (spontaneous pneumothorax) or from trauma to the chest.  Shingles infection (varicella-zoster virus).  Heart attack.  Damage to the bones, muscles, and cartilage that make up your chest wall. This can include:  Bruised bones due to injury.  Strained muscles or cartilage due to frequent or repeated coughing or overwork.  Fracture to one or more ribs.  Sore cartilage due to inflammation (costochondritis). RISK FACTORS  Risk factors for chest pain may include:  Activities that increase your risk for trauma or injury to your chest.  Respiratory infections or conditions that cause frequent coughing.  Medical conditions or overeating that can cause heartburn.  Heart disease or family history of heart disease.  Conditions or health behaviors that increase your risk of developing a blood clot.  Having had chicken pox (varicella zoster). SIGNS AND SYMPTOMS Chest pain can feel like:  Burning or tingling on the surface of your chest or deep in your chest.  Crushing, pressure, aching, or squeezing pain.  Dull or sharp pain that is worse when you move, cough, or take a deep breath.  Pain that is also felt in your back, neck, shoulder, or arm, or pain that  spreads to any of these areas. Your chest pain may come and go, or it may stay constant. DIAGNOSIS Lab tests or other studies may be needed to find the cause of your pain. Your health care provider may have you take a test called an ambulatory ECG (electrocardiogram). An ECG records your heartbeat patterns at the time the test is performed. You may also have other tests, such as:  Transthoracic echocardiogram (TTE). During  echocardiography, sound waves are used to create a picture of all of the heart structures and to look at how blood flows through your heart.  Transesophageal echocardiogram (TEE).This is a more advanced imaging test that obtains images from inside your body. It allows your health care provider to see your heart in finer detail.  Cardiac monitoring. This allows your health care provider to monitor your heart rate and rhythm in real time.  Holter monitor. This is a portable device that records your heartbeat and can help to diagnose abnormal heartbeats. It allows your health care provider to track your heart activity for several days, if needed.  Stress tests. These can be done through exercise or by taking medicine that makes your heart beat more quickly.  Blood tests.  Imaging tests. TREATMENT  Your treatment depends on what is causing your chest pain. Treatment may include:  Medicines. These may include:  Acid blockers for heartburn.  Anti-inflammatory medicine.  Pain medicine for inflammatory conditions.  Antibiotic medicine, if an infection is present.  Medicines to dissolve blood clots.  Medicines to treat coronary artery disease.  Supportive care for conditions that do not require medicines. This may include:  Resting.  Applying heat or cold packs to injured areas.  Limiting activities until pain decreases. HOME CARE INSTRUCTIONS  If you were prescribed an antibiotic medicine, finish it all even if you start to feel better.  Avoid any activities that bring on chest pain.  Do not use any tobacco products, including cigarettes, chewing tobacco, or electronic cigarettes. If you need help quitting, ask your health care provider.  Do not drink alcohol.  Take medicines only as directed by your health care provider.  Keep all follow-up visits as directed by your health care provider. This is important. This includes any further testing if your chest pain does not go  away.  If heartburn is the cause for your chest pain, you may be told to keep your head raised (elevated) while sleeping. This reduces the chance that acid will go from your stomach into your esophagus.  Make lifestyle changes as directed by your health care provider. These may include:  Getting regular exercise. Ask your health care provider to suggest some activities that are safe for you.  Eating a heart-healthy diet. A registered dietitian can help you to learn healthy eating options.  Maintaining a healthy weight.  Managing diabetes, if necessary.  Reducing stress. SEEK MEDICAL CARE IF:  Your chest pain does not go away after treatment.  You have a rash with blisters on your chest.  You have a fever. SEEK IMMEDIATE MEDICAL CARE IF:   Your chest pain is worse.  You have an increasing cough, or you cough up blood.  You have severe abdominal pain.  You have severe weakness.  You faint.  You have chills.  You have sudden, unexplained chest discomfort.  You have sudden, unexplained discomfort in your arms, back, neck, or jaw.  You have shortness of breath at any time.  You suddenly start to sweat, or your skin  gets clammy.  You feel nauseous or you vomit.  You suddenly feel light-headed or dizzy.  Your heart begins to beat quickly, or it feels like it is skipping beats. These symptoms may represent a serious problem that is an emergency. Do not wait to see if the symptoms will go away. Get medical help right away. Call your local emergency services (911 in the [place] Do not drive yourself to the hospital.   This information is not intended to replace advice given to you by your health care provider. Make sure you discuss any questions you have with your health care provider.   Document Released: 07/14/2005 Document Revised: 10/25/2014 Document Reviewed: 05/10/2014 Elsevier Interactive Patient Education [employer].

## 2015-10-29 ENCOUNTER — Ambulatory Visit (INDEPENDENT_AMBULATORY_CARE_PROVIDER_SITE_OTHER): Payer: BLUE CROSS/BLUE SHIELD | Admitting: Family Medicine

## 2015-10-29 VITALS — BP 130/80 | HR 70 | Temp 97.6°F | Resp 16 | Ht 67.0 in | Wt 163.0 lb

## 2015-10-29 DIAGNOSIS — J069 Acute upper respiratory infection, unspecified: Secondary | ICD-10-CM | POA: Diagnosis not present

## 2015-10-29 DIAGNOSIS — H409 Unspecified glaucoma: Secondary | ICD-10-CM | POA: Diagnosis not present

## 2015-10-29 MED ORDER — ONDANSETRON 8 MG PO TBDP: 8.0000 mg | ORAL_TABLET | Freq: Three times a day (TID) | ORAL | Status: DC | PRN

## 2015-10-29 MED ORDER — HYDROCODONE-GUAIFENESIN 2.5-200 MG/5ML PO SOLN: 5.0000 mL | ORAL | Status: DC | PRN

## 2015-10-29 NOTE — Progress Notes (Signed)
Subjective:  By signing my name below, I, Moises Blood, attest that this documentation has been prepared under the direction and in the presence of Delman Cheadle, MD. Electronically Signed: Moises Blood, [place]. 10/29/2015 , 1:28 PM .  Patient was seen in Room 13 .   Patient ID: [name], female    DOB: December 18, 1965, 50 y.o.   MRN: [id number] Chief Complaint  Patient presents with  . Nasal Congestion    x 2 days  . Fatigue  . Cough    Non productive  . Chills   HPI [name] is a 50 y.o. female who presents to [facility] complaining of an illness. She's had nasal congestion, rhinorrhea, sore throat, non productive cough, drainage down her throat and some chills that started 2 days ago. Last night, she woke up feeling nauseous and had diarrhea. She was dry heaving but denies vomiting. She denies any known sick contact. She denies fever, chest pain and shortness of breath. She denies history of asthma. She denies history of smoking. She received flu shot this season.   She has history of glaucoma so she hasn't taken anything OTC for this illness. Her ophthalmologist is Dr. Katy Apo at [facility] ophthal[facility].   Past Medical History  Diagnosis Date  . Glaucoma   . Chronic constipation   . GERD (gastroesophageal reflux disease)   . Hemorrhoids, internal, with bleeding DEC 2010 TCS  . Migraines   . Aneurysm ([place]) 2009 COILING    stent placed  . Abdominal pain OCT 2011 NL CBC, HFP, LIPASE    MRCP-SEPTATED GB, NL CBD, R LOBE HEMANGIOMA  . Dysrhythmia     "irregular heartbeat"  . PONV (postoperative nausea and vomiting)   . 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 Authorizing Provider  AMITIZA 24 MCG capsule TAKE ONE CAPSULE BY MOUTH ONCE DAILY WITH BREAKFAST 09/03/15  Yes Carlis Stable, NP  amitriptyline  (ELAVIL) 75 MG tablet Take 75 mg by mouth at bedtime.   Yes Historical Provider, MD  aspirin EC 81 MG tablet Take 81 mg by mouth every other day.   Yes Historical Provider, MD  brimonidine (ALPHAGAN) 0.2 % ophthalmic solution Place 1 drop into the left eye 3 (three) times daily.    Yes Historical Provider, MD  dorzolamide-timolol (COSOPT) 22.3-6.8 MG/ML ophthalmic solution Place 1 drop into the left eye 2 (two) times daily.     Yes Historical Provider, MD  latanoprost (XALATAN) 0.005 % ophthalmic solution Place 1 drop into both eyes at bedtime.   Yes Historical Provider, MD  methazolamide (NEPTAZANE) 50 MG tablet Take 25-50 mg by mouth 3 (three) times daily. Take 25 mg in the morning and 50 mg in the afternoon/evening   Yes Historical Provider, MD  Multiple Vitamin (MULTIVITAMIN) tablet Take 1 tablet by mouth daily.   Yes Historical Provider, MD  pantoprazole (PROTONIX) 40 MG tablet Take 1 tablet (40 mg total) by mouth daily. 02/03/15  Yes Orvil Feil, NP  Probiotic Product (PROBIOTIC DAILY) CAPS Take 1 capsule by mouth daily.   Yes Historical Provider, MD  azithromycin (ZITHROMAX) 250 MG tablet Take 1 tablet (250 mg total) by mouth daily. Take first 2 tablets together, then 1 every day until finished. Patient not taking: Reported on 10/09/2015 11/01/14   Ashley Murrain, NP  ciprofloxacin (CIPRO) 500 MG tablet Take 1 tablet (500 mg total) by mouth 2 (two) times daily. Patient not taking: Reported on 10/09/2015 04/18/15   Orvil Feil, NP  metroNIDAZOLE (FLAGYL) 500 MG tablet Take 1 tablet (500 mg total) by mouth 3 (three) times daily. Patient not taking: Reported on 10/09/2015 04/18/15   Orvil Feil, NP  Phenyleph-Promethazine-Cod 5-6.25-10 MG/5ML SYRP Take 5 mLs by mouth every 12 (twelve) hours as needed. Patient not taking: Reported on 10/09/2015 11/01/14   Ashley Murrain, NP  predniSONE (DELTASONE) 10 MG tablet Starting 11/02/14 take 2 tablets twice a day. Patient not taking: Reported on 10/09/2015 11/01/14    Ashley Murrain, NP  rifaximin (XIFAXAN) 550 MG TABS tablet Take 1 tablet (550 mg total) by mouth 3 (three) times daily. Patient not taking: Reported on 10/09/2015 09/06/14   Orvil Feil, NP   Allergies  Allergen Reactions  . Augmentin [Amoxicillin-Pot Clavulanate] Diarrhea and Nausea And Vomiting  . Codeine Nausea And Vomiting  . Adhesive [Tape] Rash    Review of Systems  Constitutional: Positive for chills and fatigue. Negative for fever.  HENT: Positive for congestion, postnasal drip, rhinorrhea and sore throat.   Respiratory: Positive for cough. Negative for shortness of breath.   Cardiovascular: Negative for chest pain.  Gastrointestinal: Positive for nausea and diarrhea. Negative for vomiting and constipation.      Objective:   Physical Exam  Constitutional: She is oriented to person, place, and time. She appears well-developed and well-nourished. No distress.  HENT:  Head: Normocephalic and atraumatic.  Right Ear: Tympanic membrane is injected and retracted.  Left Ear: A middle ear effusion is present.  Nose: Nose normal.  Mouth/Throat: Posterior oropharyngeal edema and posterior oropharyngeal erythema present.  Sore in back of throat, tonsil 2+ edema  Eyes: EOM are normal. Pupils are equal, round, and reactive to light.  Neck: Neck supple.  Cardiovascular: Normal rate.   Pulmonary/Chest: Effort normal and breath sounds normal. No respiratory distress. She has no decreased breath sounds. She has no wheezes.  Musculoskeletal: Normal range of motion.  Lymphadenopathy:       Head (right side): Tonsillar adenopathy present.       Head (left side): Tonsillar adenopathy present.    She has cervical adenopathy (anterior).       Right: No supraclavicular adenopathy present.       Left: No supraclavicular adenopathy present.  Neurological: She is alert and oriented to person, place, and time.  Skin: Skin is warm and dry.  Psychiatric: She has a normal mood and affect. Her  behavior is normal.  Nursing note and vitals reviewed.   BP 130/80 mmHg  Pulse 70  Temp(Src) 97.6 F (36.4 C) (Oral)  Resp 16  Ht 5\' 7"  (1.702 m)  Wt 163 lb (73.936 kg)  BMI 25.52 kg/m2  SpO2 93%  LMP 08/09/2015 (Within Days)     Assessment & Plan:   1. Acute upper respiratory infection - suspect viral, rest, hydrate, hygeine. RTC in 3-4d if not improving.  2. Glaucoma - severe so avoid most otc cough/cold meds - ok to use guaifenesin and hydrocodone - all that were on the preferred list had decongestant or antihistamine in formulation which are contraindicated - if flowtuss rx is to expensive - could just recommend rewriting for lortab and using otc guaifenesin     Meds ordered this encounter  Medications  . ondansetron (ZOFRAN-ODT) 8 MG disintegrating tablet    Sig: Take 1 tablet (8  mg total) by mouth every 8 (eight) hours as needed for nausea.    Dispense:  30 tablet    Refill:  0  . HYDROcodone-GuaiFENesin (FLOWTUSS) 2.5-200 MG/5ML SOLN    Sig: Take 5-10 mLs by mouth every 4 (four) hours as needed.    Dispense:  180 mL    Refill:  0    I personally performed the services described in this documentation, which was scribed in my presence. The recorded information has been reviewed and considered, and addended by me as needed.  Delman Cheadle, MD MPH

## 2015-10-29 NOTE — Patient Instructions (Signed)
Upper Respiratory Infection, Adult Most upper respiratory infections (URIs) are a viral infection of the air passages leading to the lungs. A URI affects the nose, throat, and upper air passages. The most common type of URI is nasopharyngitis and is typically referred to as "the common cold." URIs run their course and usually go away on their own. Most of the time, a URI does not require medical attention, but sometimes a bacterial infection in the upper airways can follow a viral infection. This is called a secondary infection. Sinus and middle ear infections are common types of secondary upper respiratory infections. Bacterial pneumonia can also complicate a URI. A URI can worsen asthma and chronic obstructive pulmonary disease (COPD). Sometimes, these complications can require emergency medical care and may be life threatening.  CAUSES Almost all URIs are caused by viruses. A virus is a type of germ and can spread from one person to another.  RISKS FACTORS You may be at risk for a URI if:   You smoke.   You have chronic heart or lung disease.  You have a weakened defense (immune) system.   You are very young or very old.   You have nasal allergies or asthma.  You work in crowded or poorly ventilated areas.  You work in health care facilities or schools. SIGNS AND SYMPTOMS  Symptoms typically develop 2-3 days after you come in contact with a cold virus. Most viral URIs last 7-10 days. However, viral URIs from the influenza virus (flu virus) can last 14-18 days and are typically more severe. Symptoms may include:   Runny or stuffy (congested) nose.   Sneezing.   Cough.   Sore throat.   Headache.   Fatigue.   Fever.   Loss of appetite.   Pain in your forehead, behind your eyes, and over your cheekbones (sinus pain).  Muscle aches.  DIAGNOSIS  Your health care provider may diagnose a URI by:  Physical exam.  Tests to check that your symptoms are not due to  another condition such as:  Strep throat.  Sinusitis.  Pneumonia.  Asthma. TREATMENT  A URI goes away on its own with time. It cannot be cured with medicines, but medicines may be prescribed or recommended to relieve symptoms. Medicines may help:  Reduce your fever.  Reduce your cough.  Relieve nasal congestion. HOME CARE INSTRUCTIONS   Take medicines only as directed by your health care provider.   Gargle warm saltwater or take cough drops to comfort your throat as directed by your health care provider.  Use a warm mist humidifier or inhale steam from a shower to increase air moisture. This may make it easier to breathe.  Drink enough fluid to keep your urine clear or pale yellow.   Eat soups and other clear broths and maintain good nutrition.   Rest as needed.   Return to work when your temperature has returned to normal or as your health care provider advises. You may need to stay home longer to avoid infecting others. You can also use a face mask and careful hand washing to prevent spread of the virus.  Increase the usage of your inhaler if you have asthma.   Do not use any tobacco products, including cigarettes, chewing tobacco, or electronic cigarettes. If you need help quitting, ask your health care provider. PREVENTION  The best way to protect yourself from getting a cold is to practice good hygiene.   Avoid oral or hand contact with people with cold   symptoms.   Wash your hands often if contact occurs.  There is no clear evidence that vitamin C, vitamin E, echinacea, or exercise reduces the chance of developing a cold. However, it is always recommended to get plenty of rest, exercise, and practice good nutrition.  SEEK MEDICAL CARE IF:   You are getting worse rather than better.   Your symptoms are not controlled by medicine.   You have chills.  You have worsening shortness of breath.  You have brown or red mucus.  You have yellow or brown nasal  discharge.  You have pain in your face, especially when you bend forward.  You have a fever.  You have swollen neck glands.  You have pain while swallowing.  You have white areas in the back of your throat. SEEK IMMEDIATE MEDICAL CARE IF:   You have severe or persistent:  Headache.  Ear pain.  Sinus pain.  Chest pain.  You have chronic lung disease and any of the following:  Wheezing.  Prolonged cough.  Coughing up blood.  A change in your usual mucus.  You have a stiff neck.  You have changes in your:  Vision.  Hearing.  Thinking.  Mood. MAKE SURE YOU:   Understand these instructions.  Will watch your condition.  Will get help right away if you are not doing well or get worse.   This information is not intended to replace advice given to you by your health care provider. Make sure you discuss any questions you have with your health care provider.   Document Released: 03/30/2001 Document Revised: 02/18/2015 Document Reviewed: 01/09/2014 Elsevier Interactive Patient Education 2016 Elsevier Inc.  

## 2016-01-06 ENCOUNTER — Encounter: Payer: Self-pay | Admitting: Gastroenterology

## 2016-01-30 DIAGNOSIS — K219 Gastro-esophageal reflux disease without esophagitis: Secondary | ICD-10-CM | POA: Diagnosis not present

## 2016-01-30 DIAGNOSIS — H402222 Chronic angle-closure glaucoma, left eye, moderate stage: Secondary | ICD-10-CM | POA: Diagnosis not present

## 2016-01-30 DIAGNOSIS — Z79899 Other long term (current) drug therapy: Secondary | ICD-10-CM | POA: Diagnosis not present

## 2016-01-30 DIAGNOSIS — I4891 Unspecified atrial fibrillation: Secondary | ICD-10-CM | POA: Diagnosis not present

## 2016-01-30 DIAGNOSIS — Z7982 Long term (current) use of aspirin: Secondary | ICD-10-CM | POA: Diagnosis not present

## 2016-01-30 DIAGNOSIS — Z91048 Other nonmedicinal substance allergy status: Secondary | ICD-10-CM | POA: Diagnosis not present

## 2016-01-30 DIAGNOSIS — Q112 Microphthalmos: Secondary | ICD-10-CM | POA: Diagnosis not present

## 2016-01-30 DIAGNOSIS — Z885 Allergy status to narcotic agent status: Secondary | ICD-10-CM | POA: Diagnosis not present

## 2016-01-30 DIAGNOSIS — H40832 Aqueous misdirection, left eye: Secondary | ICD-10-CM | POA: Diagnosis not present

## 2016-01-30 DIAGNOSIS — Z881 Allergy status to other antibiotic agents status: Secondary | ICD-10-CM | POA: Diagnosis not present

## 2016-01-30 DIAGNOSIS — Z7952 Long term (current) use of systemic steroids: Secondary | ICD-10-CM | POA: Diagnosis not present

## 2016-02-10 DIAGNOSIS — Q112 Microphthalmos: Secondary | ICD-10-CM | POA: Diagnosis not present

## 2016-02-10 DIAGNOSIS — H402222 Chronic angle-closure glaucoma, left eye, moderate stage: Secondary | ICD-10-CM | POA: Diagnosis not present

## 2016-02-10 DIAGNOSIS — H25042 Posterior subcapsular polar age-related cataract, left eye: Secondary | ICD-10-CM | POA: Diagnosis not present

## 2016-02-11 DIAGNOSIS — H2513 Age-related nuclear cataract, bilateral: Secondary | ICD-10-CM | POA: Diagnosis not present

## 2016-02-11 DIAGNOSIS — K219 Gastro-esophageal reflux disease without esophagitis: Secondary | ICD-10-CM | POA: Diagnosis not present

## 2016-02-11 DIAGNOSIS — Z7982 Long term (current) use of aspirin: Secondary | ICD-10-CM | POA: Diagnosis not present

## 2016-02-11 DIAGNOSIS — G43909 Migraine, unspecified, not intractable, without status migrainosus: Secondary | ICD-10-CM | POA: Diagnosis not present

## 2016-02-11 DIAGNOSIS — Z91048 Other nonmedicinal substance allergy status: Secondary | ICD-10-CM | POA: Diagnosis not present

## 2016-02-11 DIAGNOSIS — Q112 Microphthalmos: Secondary | ICD-10-CM | POA: Diagnosis not present

## 2016-02-11 DIAGNOSIS — Z79899 Other long term (current) drug therapy: Secondary | ICD-10-CM | POA: Diagnosis not present

## 2016-02-11 DIAGNOSIS — H25042 Posterior subcapsular polar age-related cataract, left eye: Secondary | ICD-10-CM | POA: Diagnosis not present

## 2016-02-11 DIAGNOSIS — H40832 Aqueous misdirection, left eye: Secondary | ICD-10-CM | POA: Diagnosis not present

## 2016-02-11 DIAGNOSIS — Z9889 Other specified postprocedural states: Secondary | ICD-10-CM | POA: Diagnosis not present

## 2016-02-11 DIAGNOSIS — Z880 Allergy status to penicillin: Secondary | ICD-10-CM | POA: Diagnosis not present

## 2016-02-11 DIAGNOSIS — I4891 Unspecified atrial fibrillation: Secondary | ICD-10-CM | POA: Diagnosis not present

## 2016-02-11 DIAGNOSIS — Z885 Allergy status to narcotic agent status: Secondary | ICD-10-CM | POA: Diagnosis not present

## 2016-03-04 DIAGNOSIS — H402222 Chronic angle-closure glaucoma, left eye, moderate stage: Secondary | ICD-10-CM | POA: Diagnosis not present

## 2016-03-04 DIAGNOSIS — Q112 Microphthalmos: Secondary | ICD-10-CM | POA: Diagnosis not present

## 2016-03-04 DIAGNOSIS — H40832 Aqueous misdirection, left eye: Secondary | ICD-10-CM | POA: Diagnosis not present

## 2016-03-04 DIAGNOSIS — Z48810 Encounter for surgical aftercare following surgery on the sense organs: Secondary | ICD-10-CM | POA: Diagnosis not present

## 2016-03-18 DIAGNOSIS — H2702 Aphakia, left eye: Secondary | ICD-10-CM | POA: Diagnosis not present

## 2016-03-18 DIAGNOSIS — Q112 Microphthalmos: Secondary | ICD-10-CM | POA: Diagnosis not present

## 2016-03-18 DIAGNOSIS — H40832 Aqueous misdirection, left eye: Secondary | ICD-10-CM | POA: Diagnosis not present

## 2016-03-18 DIAGNOSIS — H402222 Chronic angle-closure glaucoma, left eye, moderate stage: Secondary | ICD-10-CM | POA: Diagnosis not present

## 2016-03-20 ENCOUNTER — Ambulatory Visit (INDEPENDENT_AMBULATORY_CARE_PROVIDER_SITE_OTHER): Payer: BLUE CROSS/BLUE SHIELD | Admitting: Physician Assistant

## 2016-03-20 VITALS — BP 124/84 | HR 65 | Temp 97.9°F | Resp 16 | Ht 67.0 in | Wt 164.0 lb

## 2016-03-20 DIAGNOSIS — F19939 Other psychoactive substance use, unspecified with withdrawal, unspecified: Secondary | ICD-10-CM | POA: Diagnosis not present

## 2016-03-20 DIAGNOSIS — R110 Nausea: Secondary | ICD-10-CM

## 2016-03-20 LAB — POCT URINALYSIS DIP (MANUAL ENTRY)
BILIRUBIN UA: NEGATIVE
Blood, UA: NEGATIVE
Glucose, UA: NEGATIVE
Ketones, POC UA: NEGATIVE
NITRITE UA: NEGATIVE
PH UA: 7
PROTEIN UA: NEGATIVE
Spec Grav, UA: 1.01
Urobilinogen, UA: 0.2

## 2016-03-20 LAB — POCT CBC
Granulocyte percent: 73.8 %G (ref 37–80)
HEMATOCRIT: 43.5 % (ref 37.7–47.9)
HEMOGLOBIN: 15.1 g/dL (ref 12.2–16.2)
LYMPH, POC: 1.3 (ref 0.6–3.4)
MCH, POC: 30.5 pg (ref 27–31.2)
MCHC: 34.8 g/dL (ref 31.8–35.4)
MCV: 87.8 fL (ref 80–97)
MID (cbc): 0.4 (ref 0–0.9)
MPV: 6.4 fL (ref 0–99.8)
POC GRANULOCYTE: 4.7 (ref 2–6.9)
POC LYMPH %: 19.7 % (ref 10–50)
POC MID %: 6.5 % (ref 0–12)
Platelet Count, POC: 274 10*3/uL (ref 142–424)
RBC: 4.95 M/uL (ref 4.04–5.48)
RDW, POC: 14 %
WBC: 6.4 10*3/uL (ref 4.6–10.2)

## 2016-03-20 LAB — COMPLETE METABOLIC PANEL WITH GFR
ALT: 21 U/L (ref 6–29)
AST: 22 U/L (ref 10–35)
Albumin: 4.8 g/dL (ref 3.6–5.1)
Alkaline Phosphatase: 89 U/L (ref 33–130)
BILIRUBIN TOTAL: 0.6 mg/dL (ref 0.2–1.2)
BUN: 7 mg/dL (ref 7–25)
CALCIUM: 9.6 mg/dL (ref 8.6–10.4)
CHLORIDE: 105 mmol/L (ref 98–110)
CO2: 26 mmol/L (ref 20–31)
CREATININE: 0.72 mg/dL (ref 0.50–1.05)
GFR, Est African American: >89 mL/min (ref >=60)
GFR, Est Non African American: >89 mL/min (ref >=60)
Glucose, Bld: 102 mg/dL — ABNORMAL HIGH (ref 65–99)
Potassium: 3.7 mmol/L (ref 3.5–5.3)
Sodium: 142 mmol/L (ref 135–146)
TOTAL PROTEIN: 7.5 g/dL (ref 6.1–8.1)

## 2016-03-20 LAB — POCT URINE PREGNANCY: Preg Test, Ur: NEGATIVE

## 2016-03-20 MED ORDER — ONDANSETRON 4 MG PO TBDP
8.0000 mg | ORAL_TABLET | Freq: Once | ORAL | Status: AC
  Administered 2016-03-20: 8 mg via ORAL

## 2016-03-20 MED ORDER — ONDANSETRON HCL 4 MG PO TABS: 4.0000 mg | ORAL_TABLET | Freq: Three times a day (TID) | ORAL | Status: DC | PRN

## 2016-03-20 MED ORDER — AMITRIPTYLINE HCL 75 MG PO TABS: 75.0000 mg | ORAL_TABLET | Freq: Every day | ORAL | Status: DC

## 2016-03-20 NOTE — Progress Notes (Signed)
03/20/2016 2:19 PM   DOB: [birth year] / MRN: [id number]  SUBJECTIVE:  [name] is a 50 y.o. female presenting for nausea that started yesterday.  States she had to stop taking her amitriptyline two days ago and has been taking this for 25 years for a history of chronic migraine.  She denies cough, dysuria, leg swelling, chest pain, fever, SOB and any new DOE.  Reports she has been having diarrhea sporadically.  She does not feel that she is getting worse or better. She has tried taking phenergan and this helped her nausea some.   She is allergic to augmentin; codeine; and adhesive.   She  has a past medical history of Glaucoma; Chronic constipation; GERD (gastroesophageal reflux disease); Hemorrhoids, internal, with bleeding (DEC 2010 TCS); Migraines; Aneurysm ([place]) (2009 COILING); Abdominal pain (OCT 2011 NL CBC, HFP, LIPASE); Dysrhythmia; PONV (postoperative nausea and vomiting); and Small intestinal bacterial overgrowth (NOV 2015).    She  reports that she has never smoked. She has never used smokeless tobacco. She reports that she does not drink alcohol or use illicit drugs. She  reports that she currently engages in sexual activity and has had female partners. She reports using the following method of birth control/protection: Surgical. The patient  has past surgical history that includes Glaucoma surgery (MAR 2012); Colonoscopy; Tubal ligation; Upper gastrointestinal endoscopy; anuerysm repair; Esophagogastroduodenoscopy (09/17/2011); Appendectomy; Eye surgery; Cholecystectomy (12/06/2011); Esophagogastroduodenoscopy (N/A, 11/23/2013); and Bacterial overgrowth test (N/A, 09/02/2014).  Her family history includes Diabetes in her maternal grandmother; Heart disease in her mother and paternal grandfather; Stroke in her paternal grandmother. There is no history of Anesthesia problems, Hypotension, Malignant hyperthermia, or Pseudochol deficiency.  Review of Systems  Constitutional: Negative for  fever and chills.  Eyes: Negative for blurred vision.  Respiratory: Negative for cough and shortness of breath.   Cardiovascular: Negative for chest pain.  Gastrointestinal: Positive for nausea and diarrhea. Negative for abdominal pain.  Genitourinary: Negative for dysuria, urgency and frequency.  Musculoskeletal: Negative for myalgias.  Skin: Negative for rash.  Neurological: Negative for dizziness, tingling and headaches.  Psychiatric/Behavioral: Negative for depression. The patient is not nervous/anxious.     Problem list and medications reviewed and updated by myself where necessary, and exist elsewhere in the encounter.   OBJECTIVE:  BP 124/84 mmHg  Pulse 65  Temp(Src) 97.9 F (36.6 C) (Oral)  Resp 16  Ht 5\' 7"  (1.702 m)  Wt 164 lb (74.39 kg)  BMI 25.68 kg/m2  SpO2 99%  Physical Exam  Constitutional: She is oriented to person, place, and time. She appears well-nourished. No distress.  Eyes: EOM are normal. Pupils are equal, round, and reactive to light.  Cardiovascular: Normal rate and regular rhythm.   Pulmonary/Chest: Effort normal and breath sounds normal.  Abdominal: She exhibits no distension. There is no tenderness. There is no CVA tenderness.  Neurological: She is alert and oriented to person, place, and time. No cranial nerve deficit. Gait normal.  Skin: Skin is dry. She is not diaphoretic.  Psychiatric: She has a normal mood and affect.  Vitals reviewed.   Results for orders placed or performed in visit on 03/20/16 (from the past 72 hour(s))  POCT CBC     Status: None   Collection Time: 03/20/16  1:41 PM  Result Value Ref Range   WBC 6.4 4.6 - 10.2 K/uL   Lymph, poc 1.3 0.6 - 3.4   POC LYMPH PERCENT 19.7 10 - 50 %L   MID (cbc) 0.4 0 -  0.9   POC MID % 6.5 0 - 12 %M   POC Granulocyte 4.7 2 - 6.9   Granulocyte percent 73.8 37 - 80 %G   RBC 4.95 4.04 - 5.48 M/uL   Hemoglobin 15.1 12.2 - 16.2 g/dL   HCT, POC 43.5 37.7 - 47.9 %   MCV 87.8 80 - 97 fL   MCH,  POC 30.5 27 - 31.2 pg   MCHC 34.8 31.8 - 35.4 g/dL   RDW, POC 14.0 %   Platelet Count, POC 274 142 - 424 K/uL   MPV 6.4 0 - 99.8 fL  POCT urinalysis dipstick     Status: Abnormal   Collection Time: 03/20/16  1:42 PM  Result Value Ref Range   Color, UA yellow yellow   Clarity, UA clear clear   Glucose, UA negative negative   Bilirubin, UA negative negative   Ketones, POC UA negative negative   Spec Grav, UA 1.010    Blood, UA negative negative   pH, UA 7.0    Protein Ur, POC negative negative   Urobilinogen, UA 0.2    Nitrite, UA Negative Negative   Leukocytes, UA Trace (A) Negative  POCT urine pregnancy     Status: None   Collection Time: 03/20/16  2:15 PM  Result Value Ref Range   Preg Test, Ur Negative Negative    No results found.  ASSESSMENT AND PLAN  Maeryn was seen today for nausea, diarrhea and headache.  Diagnoses and all orders for this visit:  Nausea: This appears to be secondary to problem 2.  Will refill her medication temporarily and advise that she make it back to her prescriber ASAP.  Zofran for nausea.  -     ondansetron (ZOFRAN-ODT) disintegrating tablet 8 mg; Take 2 tablets (8 mg total) by mouth once. -     POCT CBC -     POCT urinalysis dipstick -     ondansetron (ZOFRAN) 4 MG tablet; Take 1 tablet (4 mg total) by mouth every 8 (eight) hours as needed for nausea or vomiting. -     COMPLETE METABOLIC PANEL WITH GFR -     POCT urine pregnancy -     Urine culture  Withdrawal from other psychoactive substance (HCC) -     amitriptyline (ELAVIL) 75 MG tablet; Take 1 tablet (75 mg total) by mouth at bedtime.    The patient was advised to call or return to clinic if she does not see an improvement in symptoms or to seek the care of the closest emergency department if she worsens with the above plan.   Philis Fendt, MHS, PA-C [facility] 03/20/2016 2:19 PM

## 2016-03-20 NOTE — Patient Instructions (Signed)
     IF you received an x-ray today, you will receive an invoice from [facility]. Please contact [facility] at [phone] with questions or concerns regarding your invoice.   IF you received labwork today, you will receive an invoice from [facility]. Please contact [facility] at [phone] with questions or concerns regarding your invoice.   Our billing staff will not be able to assist you with questions regarding bills from these companies.  You will be contacted with the lab results as soon as they are available. The fastest way to get your results is to activate your My Chart account. Instructions are located on the last page of this paperwork. If you have not heard from us regarding the results in 2 weeks, please contact this office.      

## 2016-03-21 LAB — URINE CULTURE: Colony Count: 50000

## 2016-03-24 DIAGNOSIS — Q112 Microphthalmos: Secondary | ICD-10-CM | POA: Diagnosis not present

## 2016-03-24 DIAGNOSIS — H2702 Aphakia, left eye: Secondary | ICD-10-CM | POA: Diagnosis not present

## 2016-03-31 DIAGNOSIS — G43019 Migraine without aura, intractable, without status migrainosus: Secondary | ICD-10-CM | POA: Diagnosis not present

## 2016-04-26 DIAGNOSIS — H2702 Aphakia, left eye: Secondary | ICD-10-CM | POA: Diagnosis not present

## 2016-05-21 DIAGNOSIS — I10 Essential (primary) hypertension: Secondary | ICD-10-CM | POA: Diagnosis not present

## 2016-06-28 DIAGNOSIS — Q112 Microphthalmos: Secondary | ICD-10-CM | POA: Diagnosis not present

## 2016-06-28 DIAGNOSIS — H402222 Chronic angle-closure glaucoma, left eye, moderate stage: Secondary | ICD-10-CM | POA: Diagnosis not present

## 2016-07-05 DIAGNOSIS — Z6827 Body mass index (BMI) 27.0-27.9, adult: Secondary | ICD-10-CM | POA: Diagnosis not present

## 2016-07-05 DIAGNOSIS — Z01419 Encounter for gynecological examination (general) (routine) without abnormal findings: Secondary | ICD-10-CM | POA: Diagnosis not present

## 2016-07-14 ENCOUNTER — Other Ambulatory Visit: Payer: Self-pay | Admitting: Obstetrics and Gynecology

## 2016-07-14 DIAGNOSIS — Z1231 Encounter for screening mammogram for malignant neoplasm of breast: Secondary | ICD-10-CM

## 2016-07-19 DIAGNOSIS — Q112 Microphthalmos: Secondary | ICD-10-CM | POA: Diagnosis not present

## 2016-07-19 DIAGNOSIS — H52212 Irregular astigmatism, left eye: Secondary | ICD-10-CM | POA: Diagnosis not present

## 2016-08-02 ENCOUNTER — Ambulatory Visit
Admission: RE | Admit: 2016-08-02 | Discharge: 2016-08-02 | Disposition: A | Discharge status: Home / Self Care | Payer: BLUE CROSS/BLUE SHIELD | Source: Ambulatory Visit | Attending: Obstetrics and Gynecology | Admitting: Obstetrics and Gynecology

## 2016-08-02 DIAGNOSIS — Z1231 Encounter for screening mammogram for malignant neoplasm of breast: Secondary | ICD-10-CM | POA: Diagnosis not present

## 2016-09-01 DIAGNOSIS — H402222 Chronic angle-closure glaucoma, left eye, moderate stage: Secondary | ICD-10-CM | POA: Diagnosis not present

## 2016-09-01 DIAGNOSIS — H52212 Irregular astigmatism, left eye: Secondary | ICD-10-CM | POA: Diagnosis not present

## 2016-09-01 DIAGNOSIS — Q112 Microphthalmos: Secondary | ICD-10-CM | POA: Diagnosis not present

## 2016-09-22 DIAGNOSIS — J069 Acute upper respiratory infection, unspecified: Secondary | ICD-10-CM | POA: Diagnosis not present

## 2016-09-22 DIAGNOSIS — R6889 Other general symptoms and signs: Secondary | ICD-10-CM | POA: Diagnosis not present

## 2016-09-27 DIAGNOSIS — H402222 Chronic angle-closure glaucoma, left eye, moderate stage: Secondary | ICD-10-CM | POA: Diagnosis not present

## 2016-11-15 DIAGNOSIS — T8522XA Displacement of intraocular lens, initial encounter: Secondary | ICD-10-CM | POA: Diagnosis not present

## 2016-11-15 DIAGNOSIS — Z8679 Personal history of other diseases of the circulatory system: Secondary | ICD-10-CM | POA: Diagnosis not present

## 2016-11-15 DIAGNOSIS — Z9889 Other specified postprocedural states: Secondary | ICD-10-CM | POA: Diagnosis not present

## 2016-11-15 DIAGNOSIS — H402220 Chronic angle-closure glaucoma, left eye, stage unspecified: Secondary | ICD-10-CM | POA: Diagnosis not present

## 2017-02-21 DIAGNOSIS — Q112 Microphthalmos: Secondary | ICD-10-CM | POA: Diagnosis not present

## 2017-02-21 DIAGNOSIS — H402211 Chronic angle-closure glaucoma, right eye, mild stage: Secondary | ICD-10-CM | POA: Diagnosis not present

## 2017-02-21 DIAGNOSIS — H402222 Chronic angle-closure glaucoma, left eye, moderate stage: Secondary | ICD-10-CM | POA: Diagnosis not present

## 2017-02-21 DIAGNOSIS — Z961 Presence of intraocular lens: Secondary | ICD-10-CM | POA: Diagnosis not present

## 2017-02-24 DIAGNOSIS — Z6827 Body mass index (BMI) 27.0-27.9, adult: Secondary | ICD-10-CM | POA: Diagnosis not present

## 2017-02-24 DIAGNOSIS — R05 Cough: Secondary | ICD-10-CM | POA: Diagnosis not present

## 2017-03-24 DIAGNOSIS — Z961 Presence of intraocular lens: Secondary | ICD-10-CM | POA: Diagnosis not present

## 2017-06-09 ENCOUNTER — Encounter: Payer: Self-pay | Admitting: Family Medicine

## 2017-06-09 ENCOUNTER — Ambulatory Visit (INDEPENDENT_AMBULATORY_CARE_PROVIDER_SITE_OTHER): Payer: BLUE CROSS/BLUE SHIELD | Admitting: Family Medicine

## 2017-06-09 DIAGNOSIS — J029 Acute pharyngitis, unspecified: Secondary | ICD-10-CM | POA: Diagnosis not present

## 2017-06-09 DIAGNOSIS — J069 Acute upper respiratory infection, unspecified: Secondary | ICD-10-CM

## 2017-06-09 LAB — POCT RAPID STREP A (OFFICE): Rapid Strep A Screen: NEGATIVE

## 2017-06-09 NOTE — Patient Instructions (Addendum)
     IF you received an x-ray today, you will receive an invoice from [facility]. Please contact [facility] at [phone] with questions or concerns regarding your invoice.   IF you received labwork today, you will receive an invoice from [facility]. Please contact [facility] at [phone] with questions or concerns regarding your invoice.   Our billing staff will not be able to assist you with questions regarding bills from these companies.  You will be contacted with the lab results as soon as they are available. The fastest way to get your results is to activate your My Chart account. Instructions are located on the last page of this paperwork. If you have not heard from us regarding the results in 2 weeks, please contact this office.     Upper Respiratory Infection, Adult Most upper respiratory infections (URIs) are caused by a virus. A URI affects the nose, throat, and upper air passages. The most common type of URI is often called "the common cold." Follow these instructions at home:  Take medicines only as told by your doctor.  Gargle warm saltwater or take cough drops to comfort your throat as told by your doctor.  Use a warm mist humidifier or inhale steam from a shower to increase air moisture. This may make it easier to breathe.  Drink enough fluid to keep your pee (urine) clear or pale yellow.  Eat soups and other clear broths.  Have a healthy diet.  Rest as needed.  Go back to work when your fever is gone or your doctor says it is okay. ? You may need to stay home longer to avoid giving your URI to others. ? You can also wear a face mask and wash your hands often to prevent spread of the virus.  Use your inhaler more if you have asthma.  Do not use any tobacco products, including cigarettes, chewing tobacco, or electronic cigarettes. If you need help quitting, ask your doctor. Contact a doctor if:  You are getting worse, not better.  Your  symptoms are not helped by medicine.  You have chills.  You are getting more short of breath.  You have brown or red mucus.  You have yellow or brown discharge from your nose.  You have pain in your face, especially when you bend forward.  You have a fever.  You have puffy (swollen) neck glands.  You have pain while swallowing.  You have white areas in the back of your throat. Get help right away if:  You have very bad or constant: ? Headache. ? Ear pain. ? Pain in your forehead, behind your eyes, and over your cheekbones (sinus pain). ? Chest pain.  You have long-lasting (chronic) lung disease and any of the following: ? Wheezing. ? Long-lasting cough. ? Coughing up blood. ? A change in your usual mucus.  You have a stiff neck.  You have changes in your: ? Vision. ? Hearing. ? Thinking. ? Mood. This information is not intended to replace advice given to you by your health care provider. Make sure you discuss any questions you have with your health care provider. Document Released: 03/22/2008 Document Revised: 06/06/2016 Document Reviewed: 01/09/2014 Elsevier Interactive Patient Education  2018 Elsevier Inc.  

## 2017-06-09 NOTE — Progress Notes (Signed)
8/23/201811:56 AM  [name] 04/19/1966, 51 y.o. female [id number]  Chief Complaint  Patient presents with  . Sore Throat    X 2-3 days  . Headache     SINUS   . Fatigue    X 2-3 days    HPI:   Patient is a 51 y.o. female who presents today for 3 days of not feeling well. C/o sore throat, frontal headaches, malaise, nasal congestion, mild rhinorrhea. Denies fevers, chills, cough. + sick contact at work. Denies sinus surgery. Worried about strep throat as her mother had rheumatic fever.   Depression screen [facility] 2/9 06/09/2017 03/20/2016 10/29/2015  Decreased Interest 0 0 0  Down, Depressed, Hopeless 0 0 0  PHQ - 2 Score 0 0 0    Allergies  Allergen Reactions  . Augmentin [Amoxicillin-Pot Clavulanate] Diarrhea and Nausea And Vomiting  . Codeine Nausea And Vomiting  . Adhesive [Tape] Rash    Current Outpatient Prescriptions on File Prior to Visit  Medication Sig Dispense Refill  . AMITIZA 24 MCG capsule TAKE ONE CAPSULE BY MOUTH ONCE DAILY WITH BREAKFAST 30 capsule 5  . amitriptyline (ELAVIL) 75 MG tablet Take 1 tablet (75 mg total) by mouth at bedtime. 60 tablet 0  . aspirin EC 81 MG tablet Take 81 mg by mouth every other day.    . Omeprazole (PRILOSEC PO) Take by mouth.    . brimonidine (ALPHAGAN) 0.2 % ophthalmic solution Place 1 drop into the left eye 3 (three) times daily. Reported on 03/20/2016    . dorzolamide-timolol (COSOPT) 22.3-6.8 MG/ML ophthalmic solution Place 1 drop into the left eye 2 (two) times daily.      Marland Kitchen HYDROcodone-GuaiFENesin (FLOWTUSS) 2.5-200 MG/5ML SOLN Take 5-10 mLs by mouth every 4 (four) hours as needed. (Patient not taking: Reported on 03/20/2016) 180 mL 0  . latanoprost (XALATAN) 0.005 % ophthalmic solution Place 1 drop into both eyes at bedtime.    . methazolamide (NEPTAZANE) 50 MG tablet Take 25-50 mg by mouth 3 (three) times daily. Reported on 03/20/2016    . Multiple Vitamin (MULTIVITAMIN) tablet Take 1 tablet by mouth daily.    .  ondansetron (ZOFRAN) 4 MG tablet Take 1 tablet (4 mg total) by mouth every 8 (eight) hours as needed for nausea or vomiting. (Patient not taking: Reported on 06/09/2017) 20 tablet 0  . ondansetron (ZOFRAN-ODT) 8 MG disintegrating tablet Take 1 tablet (8 mg total) by mouth every 8 (eight) hours as needed for nausea. (Patient not taking: Reported on 03/20/2016) 30 tablet 0  . pantoprazole (PROTONIX) 40 MG tablet Take 1 tablet (40 mg total) by mouth daily. (Patient not taking: Reported on 03/20/2016) 30 tablet 5  . Probiotic Product (PROBIOTIC DAILY) CAPS Take 1 capsule by mouth daily.     No current facility-administered medications on file prior to visit.     Past Medical History:  Diagnosis Date  . Abdominal pain OCT 2011 NL CBC, HFP, LIPASE   MRCP-SEPTATED GB, NL CBD, R LOBE HEMANGIOMA  . Aneurysm ([place]) 2009 COILING   stent placed  . Chronic constipation   . Dysrhythmia    "irregular heartbeat"  . GERD (gastroesophageal reflux disease)   . Glaucoma   . Hemorrhoids, internal, with bleeding DEC 2010 TCS  . Migraines   . PONV (postoperative nausea and vomiting)   . 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 anuerysm repair     coil  . APPENDECTOMY    . BACTERIAL OVERGROWTH TEST N/A 09/02/2014   POSITIVE  . CHOLECYSTECTOMY  12/06/2011   Procedure: LAPAROSCOPIC CHOLECYSTECTOMY;  Surgeon: Donato Heinz, MD;  Location: [facility];  Service: General;  Laterality: N/A;  . COLONOSCOPY     DEC 2010 IH  . ESOPHAGOGASTRODUODENOSCOPY  09/17/2011   Dr. Oneida Alar :moderate gastritis  . ESOPHAGOGASTRODUODENOSCOPY N/A 11/23/2013   Dr. Theophilus Bones Gastritis in the body and the antrum of the stomach/ SINGLE diverticulum in the second portion of the duodenum, mild chronic gastritis on path. Negative H.pylori. Pursue HBT if persistent issues.   Marland Kitchen EYE SURGERY      glaucoma-unsure if both or just one of them  . GLAUCOMA SURGERY  MAR 2012  . TUBAL LIGATION    . UPPER GASTROINTESTINAL ENDOSCOPY     OCT 2011 RING DILATED TO 16MM, CANDIDA ESOPHAGITIS, CHRONIC GASTRITIS    Social History  Substance Use Topics  . Smoking status: Never Smoker  . Smokeless tobacco: Never Used  . Alcohol use No    Family History  Problem Relation Age of Onset  . Heart disease Mother   . Diabetes Maternal Grandmother   . Stroke Paternal Grandmother   . Heart disease Paternal Grandfather   . Anesthesia problems Neg Hx   . Hypotension Neg Hx   . Malignant hyperthermia Neg Hx   . Pseudochol deficiency Neg Hx     Review of Systems  Constitutional: Negative for chills and fever.  HENT: Positive for congestion. Negative for ear pain and sore throat.   Respiratory: Negative for cough and shortness of breath.   Cardiovascular: Negative for chest pain, palpitations and leg swelling.  Gastrointestinal: Negative for abdominal pain, diarrhea, nausea and vomiting.  Skin: Negative for rash.  Neurological: Positive for headaches.     OBJECTIVE:  There were no vitals taken for this visit.  Physical Exam  Constitutional: She is oriented to person, place, and time and well-developed, well-nourished, and in no distress.  HENT:  Head: Normocephalic and atraumatic.  Right Ear: Hearing, tympanic membrane, external ear and ear canal normal.  Left Ear: Hearing, tympanic membrane, external ear and ear canal normal.  Mouth/Throat: Posterior oropharyngeal edema and posterior oropharyngeal erythema present. No oropharyngeal exudate.  Eyes: Pupils are equal, round, and reactive to light. EOM are normal.  Neck: Neck supple.  Cardiovascular: Normal rate, regular rhythm and normal heart sounds.  Exam reveals no gallop and no friction rub.   No murmur heard. Pulmonary/Chest: Effort normal and breath sounds normal. She has no wheezes. She has no rales.  Lymphadenopathy:    She has no  cervical adenopathy.  Neurological: She is alert and oriented to person, place, and time. Gait normal.  Skin: Skin is warm and dry.    Results for orders placed or performed in visit on 06/09/17  POCT rapid strep A  Result Value Ref Range   Rapid Strep A Screen Negative Negative     ASSESSMENT and PLAN:  1. URI, acute Discussed supportive measures for URI: increase hydration, rest, OTC medications, etc. RTC precautions discussed.  2. Sore throat - POCT rapid strep A - negative        Rutherford Guys, MD Primary Care at [place], [place] [address] Ph.  [phone] Fax [phone]

## 2017-06-11 LAB — CULTURE, GROUP A STREP: Strep A Culture: NEGATIVE

## 2017-06-27 DIAGNOSIS — Q112 Microphthalmos: Secondary | ICD-10-CM | POA: Diagnosis not present

## 2017-06-27 DIAGNOSIS — H02402 Unspecified ptosis of left eyelid: Secondary | ICD-10-CM | POA: Diagnosis not present

## 2017-06-27 DIAGNOSIS — H402222 Chronic angle-closure glaucoma, left eye, moderate stage: Secondary | ICD-10-CM | POA: Diagnosis not present

## 2017-06-27 DIAGNOSIS — H402211 Chronic angle-closure glaucoma, right eye, mild stage: Secondary | ICD-10-CM | POA: Diagnosis not present

## 2017-07-01 ENCOUNTER — Other Ambulatory Visit: Payer: Self-pay | Admitting: Obstetrics and Gynecology

## 2017-07-01 DIAGNOSIS — Z1231 Encounter for screening mammogram for malignant neoplasm of breast: Secondary | ICD-10-CM

## 2017-07-14 DIAGNOSIS — Z13820 Encounter for screening for osteoporosis: Secondary | ICD-10-CM | POA: Diagnosis not present

## 2017-07-14 DIAGNOSIS — Z6827 Body mass index (BMI) 27.0-27.9, adult: Secondary | ICD-10-CM | POA: Diagnosis not present

## 2017-07-14 DIAGNOSIS — Z01419 Encounter for gynecological examination (general) (routine) without abnormal findings: Secondary | ICD-10-CM | POA: Diagnosis not present

## 2017-08-08 ENCOUNTER — Ambulatory Visit
Admission: RE | Admit: 2017-08-08 | Discharge: 2017-08-08 | Disposition: A | Discharge status: Home / Self Care | Payer: BLUE CROSS/BLUE SHIELD | Source: Ambulatory Visit | Attending: Obstetrics and Gynecology | Admitting: Obstetrics and Gynecology

## 2017-08-08 DIAGNOSIS — Z1231 Encounter for screening mammogram for malignant neoplasm of breast: Secondary | ICD-10-CM

## 2017-08-26 DIAGNOSIS — S0502XA Injury of conjunctiva and corneal abrasion without foreign body, left eye, initial encounter: Secondary | ICD-10-CM | POA: Diagnosis not present

## 2017-08-26 DIAGNOSIS — H402222 Chronic angle-closure glaucoma, left eye, moderate stage: Secondary | ICD-10-CM | POA: Diagnosis not present

## 2017-10-31 DIAGNOSIS — H402211 Chronic angle-closure glaucoma, right eye, mild stage: Secondary | ICD-10-CM | POA: Diagnosis not present

## 2017-10-31 DIAGNOSIS — H402222 Chronic angle-closure glaucoma, left eye, moderate stage: Secondary | ICD-10-CM | POA: Diagnosis not present

## 2017-10-31 DIAGNOSIS — Q112 Microphthalmos: Secondary | ICD-10-CM | POA: Diagnosis not present

## 2017-10-31 DIAGNOSIS — H2511 Age-related nuclear cataract, right eye: Secondary | ICD-10-CM | POA: Diagnosis not present

## 2017-11-30 NOTE — Progress Notes (Signed)
REVIEWED-NO ADDITIONAL RECOMMENDATIONS. 

## 2017-12-01 ENCOUNTER — Telehealth (HOSPITAL_COMMUNITY): Payer: Self-pay

## 2017-12-01 NOTE — Telephone Encounter (Signed)
Called to schedule f/u, left message for pt to return call. AW 

## 2017-12-07 ENCOUNTER — Other Ambulatory Visit (HOSPITAL_COMMUNITY): Payer: Self-pay | Admitting: Interventional Radiology

## 2017-12-07 DIAGNOSIS — I729 Aneurysm of unspecified site: Secondary | ICD-10-CM

## 2017-12-29 ENCOUNTER — Ambulatory Visit (HOSPITAL_COMMUNITY)
Admission: RE | Admit: 2017-12-29 | Discharge: 2017-12-29 | Disposition: A | Discharge status: Home / Self Care | Payer: BLUE CROSS/BLUE SHIELD | Source: Ambulatory Visit | Attending: Interventional Radiology | Admitting: Interventional Radiology

## 2017-12-29 DIAGNOSIS — I729 Aneurysm of unspecified site: Secondary | ICD-10-CM

## 2017-12-29 DIAGNOSIS — I720 Aneurysm of carotid artery: Secondary | ICD-10-CM | POA: Diagnosis not present

## 2017-12-29 HISTORY — PX: IR RADIOLOGIST EVAL & MGMT: IMG5224

## 2017-12-30 ENCOUNTER — Encounter (HOSPITAL_COMMUNITY): Payer: Self-pay | Admitting: Interventional Radiology

## 2018-01-03 ENCOUNTER — Other Ambulatory Visit (HOSPITAL_COMMUNITY): Payer: Self-pay | Admitting: Interventional Radiology

## 2018-01-03 DIAGNOSIS — I729 Aneurysm of unspecified site: Secondary | ICD-10-CM

## 2018-01-11 ENCOUNTER — Ambulatory Visit (HOSPITAL_COMMUNITY): Payer: BLUE CROSS/BLUE SHIELD

## 2018-01-11 ENCOUNTER — Encounter (HOSPITAL_COMMUNITY): Payer: Self-pay

## 2018-01-20 ENCOUNTER — Ambulatory Visit (HOSPITAL_COMMUNITY)
Admission: RE | Admit: 2018-01-20 | Discharge: 2018-01-20 | Disposition: A | Discharge status: Home / Self Care | Payer: BLUE CROSS/BLUE SHIELD | Source: Ambulatory Visit | Attending: Interventional Radiology | Admitting: Interventional Radiology

## 2018-01-20 DIAGNOSIS — I729 Aneurysm of unspecified site: Secondary | ICD-10-CM

## 2018-01-20 DIAGNOSIS — I671 Cerebral aneurysm, nonruptured: Secondary | ICD-10-CM | POA: Diagnosis not present

## 2018-01-20 LAB — CREATININE, SERUM
Creatinine, Ser: 0.69 mg/dL (ref 0.44–1.00)
GFR calc non Af Amer: >60 mL/min (ref >60)

## 2018-01-20 MED ORDER — GADOBENATE DIMEGLUMINE 529 MG/ML IV SOLN
15.0000 mL | Freq: Once | INTRAVENOUS | Status: AC
  Administered 2018-01-20: 15 mL via INTRAVENOUS

## 2018-02-07 ENCOUNTER — Telehealth (HOSPITAL_COMMUNITY): Payer: Self-pay

## 2018-02-07 NOTE — Telephone Encounter (Signed)
Per Dr. Estanislado Pandy, the pt does not need to continue to have f/u scans since her aneurysm is gone and her mri was stable. Pt agreed to give [place] a call if she starts to have sx. AW

## 2018-03-06 DIAGNOSIS — H402222 Chronic angle-closure glaucoma, left eye, moderate stage: Secondary | ICD-10-CM | POA: Diagnosis not present

## 2018-03-06 DIAGNOSIS — H402211 Chronic angle-closure glaucoma, right eye, mild stage: Secondary | ICD-10-CM | POA: Diagnosis not present

## 2018-03-14 ENCOUNTER — Ambulatory Visit: Payer: BLUE CROSS/BLUE SHIELD | Admitting: Family Medicine

## 2018-03-14 ENCOUNTER — Encounter: Payer: Self-pay | Admitting: Family Medicine

## 2018-03-14 ENCOUNTER — Ambulatory Visit: Payer: Self-pay | Admitting: *Deleted

## 2018-03-14 ENCOUNTER — Other Ambulatory Visit: Payer: Self-pay

## 2018-03-14 VITALS — BP 150/68 | HR 72 | Temp 98.7°F | Ht 67.2 in | Wt 169.2 lb

## 2018-03-14 DIAGNOSIS — J069 Acute upper respiratory infection, unspecified: Secondary | ICD-10-CM | POA: Diagnosis not present

## 2018-03-14 MED ORDER — HYDROCODONE-GUAIFENESIN 2.5-200 MG/5ML PO SOLN: 5.0000 mL | ORAL | 0 refills | Status: DC | PRN

## 2018-03-14 MED ORDER — BENZONATATE 100 MG PO CAPS: 100.0000 mg | ORAL_CAPSULE | Freq: Three times a day (TID) | ORAL | 0 refills | Status: DC | PRN

## 2018-03-14 MED ORDER — IPRATROPIUM BROMIDE 0.03 % NA SOLN: 1.0000 | Freq: Two times a day (BID) | NASAL | 0 refills | Status: DC

## 2018-03-14 NOTE — Patient Instructions (Addendum)
     IF you received an x-ray today, you will receive an invoice from [facility]. Please contact [facility] at [phone] with questions or concerns regarding your invoice.   IF you received labwork today, you will receive an invoice from [facility]. Please contact [facility] at [phone] with questions or concerns regarding your invoice.   Our billing staff will not be able to assist you with questions regarding bills from these companies.  You will be contacted with the lab results as soon as they are available. The fastest way to get your results is to activate your My Chart account. Instructions are located on the last page of this paperwork. If you have not heard from us regarding the results in 2 weeks, please contact this office.     Upper Respiratory Infection, Adult Most upper respiratory infections (URIs) are caused by a virus. A URI affects the nose, throat, and upper air passages. The most common type of URI is often called "the common cold." Follow these instructions at home:  Take medicines only as told by your doctor.  Gargle warm saltwater or take cough drops to comfort your throat as told by your doctor.  Use a warm mist humidifier or inhale steam from a shower to increase air moisture. This may make it easier to breathe.  Drink enough fluid to keep your pee (urine) clear or pale yellow.  Eat soups and other clear broths.  Have a healthy diet.  Rest as needed.  Go back to work when your fever is gone or your doctor says it is okay. ? You may need to stay home longer to avoid giving your URI to others. ? You can also wear a face mask and wash your hands often to prevent spread of the virus.  Use your inhaler more if you have asthma.  Do not use any tobacco products, including cigarettes, chewing tobacco, or electronic cigarettes. If you need help quitting, ask your doctor. Contact a doctor if:  You are getting worse, not better.  Your  symptoms are not helped by medicine.  You have chills.  You are getting more short of breath.  You have brown or red mucus.  You have yellow or brown discharge from your nose.  You have pain in your face, especially when you bend forward.  You have a fever.  You have puffy (swollen) neck glands.  You have pain while swallowing.  You have white areas in the back of your throat. Get help right away if:  You have very bad or constant: ? Headache. ? Ear pain. ? Pain in your forehead, behind your eyes, and over your cheekbones (sinus pain). ? Chest pain.  You have long-lasting (chronic) lung disease and any of the following: ? Wheezing. ? Long-lasting cough. ? Coughing up blood. ? A change in your usual mucus.  You have a stiff neck.  You have changes in your: ? Vision. ? Hearing. ? Thinking. ? Mood. This information is not intended to replace advice given to you by your health care provider. Make sure you discuss any questions you have with your health care provider. Document Released: 03/22/2008 Document Revised: 06/06/2016 Document Reviewed: 01/09/2014 Elsevier Interactive Patient Education  2018 Elsevier Inc.  

## 2018-03-14 NOTE — Progress Notes (Signed)
5/28/20192:53 PM  [name] [birth year], 52 y.o. female [id number]  Chief Complaint  Patient presents with  . Cough    productive coughing, sore throat    HPI:   Patient is a 52 y.o. female  who presents today for 4 days of sore throat, runny nose, productive cough and malaise. She denies any fever, chills, SOB, ear pain, sinus pain. She denies any vomiting or rashes. She has had some greenish diarrhea. Denies any known sick contacts Has been taking zyrtec and tylenol. Last APAP dose this morning.  Fall Risk  03/14/2018 06/09/2017 03/20/2016  Falls in the past year? No No No     Depression screen [facility] 2/9 03/14/2018 06/09/2017 03/20/2016  Decreased Interest 0 0 0  Down, Depressed, Hopeless 0 0 0  PHQ - 2 Score 0 0 0    Allergies  Allergen Reactions  . Augmentin [Amoxicillin-Pot Clavulanate] Diarrhea and Nausea And Vomiting  . Codeine Nausea And Vomiting  . Adhesive [Tape] Rash    Prior to Admission medications   Medication Sig Start Date End Date Taking? Authorizing Provider  amitriptyline (ELAVIL) 75 MG tablet Take 1 tablet (75 mg total) by mouth at bedtime. 03/20/16  Yes Tereasa Coop, PA-C  aspirin EC 81 MG tablet Take 81 mg by mouth every other day.   Yes [provider]  dorzolamide-timolol (COSOPT) 22.3-6.8 MG/ML ophthalmic solution Place 1 drop into the left eye 2 (two) times daily.     Yes [provider]  hydrochlorothiazide (MICROZIDE) 12.5 MG capsule Take 12.5 mg by mouth daily.   Yes [provider]  latanoprost (XALATAN) 0.005 % ophthalmic solution Place 1 drop into both eyes at bedtime.   Yes [provider]  Multiple Vitamin (MULTIVITAMIN) tablet Take 1 tablet by mouth daily.   Yes [provider]  Omeprazole (PRILOSEC PO) Take by mouth.   Yes [provider]  Probiotic Product (PROBIOTIC DAILY) CAPS Take 1 capsule by mouth daily.   Yes [provider]  AMITIZA 24 MCG capsule TAKE ONE CAPSULE BY  MOUTH ONCE DAILY WITH BREAKFAST 09/03/15   Carlis Stable, NP  brimonidine (ALPHAGAN) 0.2 % ophthalmic solution Place 1 drop into the left eye 3 (three) times daily. Reported on 03/20/2016    [provider]  HYDROcodone-GuaiFENesin (FLOWTUSS) 2.5-200 MG/5ML SOLN Take 5-10 mLs by mouth every 4 (four) hours as needed. Patient not taking: Reported on 03/20/2016 10/29/15   Shawnee Knapp, MD  methazolamide (NEPTAZANE) 50 MG tablet Take 25-50 mg by mouth 3 (three) times daily. Reported on 03/20/2016    [provider]  ondansetron (ZOFRAN) 4 MG tablet Take 1 tablet (4 mg total) by mouth every 8 (eight) hours as needed for nausea or vomiting. Patient not taking: Reported on 06/09/2017 03/20/16   Tereasa Coop, PA-C  pantoprazole (PROTONIX) 40 MG tablet Take 1 tablet (40 mg total) by mouth daily. Patient not taking: Reported on 03/20/2016 02/03/15   Annitta Needs, NP    Past Medical History:  Diagnosis Date  . Abdominal pain OCT 2011 NL CBC, HFP, LIPASE   MRCP-SEPTATED GB, NL CBD, R LOBE HEMANGIOMA  . Aneurysm ([place]) 2009 COILING   stent placed  . Chronic constipation   . Dysrhythmia    "irregular heartbeat"  . GERD (gastroesophageal reflux disease)   . Glaucoma   . Hemorrhoids, internal, with bleeding DEC 2010 TCS  . Migraines   . PONV (postoperative nausea and vomiting)   . Small intestinal  bacterial overgrowth NOV 2015   HBT PEAK 72(135 MINS) TROUGH 55 (150 MINS), PEAK 71(165 MINS) TROUGH 29 (180 MINS)    Past Surgical History:  Procedure Laterality Date  . anuerysm repair     coil  . APPENDECTOMY    . BACTERIAL OVERGROWTH TEST N/A 09/02/2014   POSITIVE  . CHOLECYSTECTOMY  12/06/2011   Procedure: LAPAROSCOPIC CHOLECYSTECTOMY;  Surgeon: Donato Heinz, MD;  Location: [facility];  Service: General;  Laterality: N/A;  . COLONOSCOPY     DEC 2010 IH  . ESOPHAGOGASTRODUODENOSCOPY  09/17/2011   Dr. Oneida Alar :moderate gastritis  . ESOPHAGOGASTRODUODENOSCOPY N/A 11/23/2013   Dr.  Theophilus Bones Gastritis in the body and the antrum of the stomach/ SINGLE diverticulum in the second portion of the duodenum, mild chronic gastritis on path. Negative H.pylori. Pursue HBT if persistent issues.   Marland Kitchen EYE SURGERY     glaucoma-unsure if both or just one of them  . GLAUCOMA SURGERY  MAR 2012  . IR RADIOLOGIST EVAL & MGMT  12/29/2017  . TUBAL LIGATION    . UPPER GASTROINTESTINAL ENDOSCOPY     OCT 2011 RING DILATED TO 16MM, CANDIDA ESOPHAGITIS, CHRONIC GASTRITIS    Social History   Tobacco Use  . Smoking status: Never Smoker  . Smokeless tobacco: Never Used  Substance Use Topics  . Alcohol use: No    Family History  Problem Relation Age of Onset  . Heart disease Mother   . Diabetes Maternal Grandmother   . Stroke Paternal Grandmother   . Heart disease Paternal Grandfather   . Breast cancer Cousin   . Anesthesia problems Neg Hx   . Hypotension Neg Hx   . Malignant hyperthermia Neg Hx   . Pseudochol deficiency Neg Hx     ROS Per hpi  OBJECTIVE:  Blood pressure (!) 150/68, pulse 72, temperature 98.7 F (37.1 C), temperature source Oral, height 5' 7.2" (1.707 m), weight 169 lb 3.2 oz (76.7 kg), SpO2 99 %.  Physical Exam  Constitutional: She is oriented to person, place, and time. She appears well-nourished. She has a sickly appearance.  HENT:  Head: Normocephalic and atraumatic.  Right Ear: Hearing, tympanic membrane, external ear and ear canal normal.  Left Ear: Hearing, tympanic membrane, external ear and ear canal normal.  Mouth/Throat: Oropharynx is clear and moist and mucous membranes are normal.  Eyes: Pupils are equal, round, and reactive to light. Conjunctivae and EOM are normal.  Neck: Neck supple.  Cardiovascular: Normal rate, regular rhythm and normal heart sounds. Exam reveals no gallop and no friction rub.  No murmur heard. Pulmonary/Chest: Effort normal and breath sounds normal. She has no wheezes. She has no rales.  Lymphadenopathy:    She has  no cervical adenopathy.  Neurological: She is alert and oriented to person, place, and time.  Skin: Skin is warm and dry.  Psychiatric: She has a normal mood and affect.  Nursing note and vitals reviewed.    ASSESSMENT and PLAN  1. Acute upper respiratory infection Discussed supportive measures for URI: increase hydration, rest, OTC medications, etc. RTC precautions discussed. Work excuse given for today and tomorrow. Discussed rechecking BP once feeling well. Other orders - HYDROcodone-guaiFENesin (FLOWTUSS) 2.5-200 MG/5ML SOLN; Take 5-10 mLs by mouth every 4 (four) hours as needed. - benzonatate (TESSALON) 100 MG capsule; Take 1-2 capsules (100-200 mg total) by mouth 3 (three) times daily as needed for cough. - ipratropium (ATROVENT) 0.03 % nasal spray; Place 1 spray into both nostrils 2 (two) times daily.  Return if symptoms worsen or fail to improve.    Rutherford Guys, MD Primary Care at [place],  [address] Ph.  [phone] Fax [phone]

## 2018-03-14 NOTE — Telephone Encounter (Addendum)
Pharmacy called to report Hydrocodone-Guaifenesin ( Flowtuss) sent for pt today by Dr. Pamella Pert has been taken off the market.  Please advise:    [facility] [phone]   rx sent for just guaifenesin, patient allergic to codeine.

## 2018-03-16 MED ORDER — GUAIFENESIN 100 MG/5ML PO SOLN: 10.0000 mL | Freq: Four times a day (QID) | ORAL | 0 refills | Status: DC | PRN

## 2018-03-16 NOTE — Addendum Note (Signed)
Addended by: Rutherford Guys on: 03/16/2018 09:22 AM   Modules accepted: Orders

## 2018-03-18 DIAGNOSIS — G43909 Migraine, unspecified, not intractable, without status migrainosus: Secondary | ICD-10-CM | POA: Diagnosis not present

## 2018-03-18 DIAGNOSIS — E876 Hypokalemia: Secondary | ICD-10-CM | POA: Diagnosis not present

## 2018-03-18 DIAGNOSIS — R7301 Impaired fasting glucose: Secondary | ICD-10-CM | POA: Diagnosis not present

## 2018-03-18 DIAGNOSIS — Z79899 Other long term (current) drug therapy: Secondary | ICD-10-CM | POA: Diagnosis not present

## 2018-03-18 DIAGNOSIS — Z0001 Encounter for general adult medical examination with abnormal findings: Secondary | ICD-10-CM | POA: Diagnosis not present

## 2018-03-27 DIAGNOSIS — Z6828 Body mass index (BMI) 28.0-28.9, adult: Secondary | ICD-10-CM | POA: Diagnosis not present

## 2018-03-27 DIAGNOSIS — I10 Essential (primary) hypertension: Secondary | ICD-10-CM | POA: Diagnosis not present

## 2018-03-27 DIAGNOSIS — R7301 Impaired fasting glucose: Secondary | ICD-10-CM | POA: Diagnosis not present

## 2018-03-27 DIAGNOSIS — Z0001 Encounter for general adult medical examination with abnormal findings: Secondary | ICD-10-CM | POA: Diagnosis not present

## 2018-03-30 ENCOUNTER — Other Ambulatory Visit (HOSPITAL_COMMUNITY): Payer: Self-pay | Admitting: Internal Medicine

## 2018-03-30 ENCOUNTER — Ambulatory Visit (HOSPITAL_COMMUNITY)
Admission: RE | Admit: 2018-03-30 | Discharge: 2018-03-30 | Disposition: A | Discharge status: Home / Self Care | Payer: BLUE CROSS/BLUE SHIELD | Source: Ambulatory Visit | Attending: Internal Medicine | Admitting: Internal Medicine

## 2018-03-30 DIAGNOSIS — R05 Cough: Secondary | ICD-10-CM | POA: Insufficient documentation

## 2018-03-30 DIAGNOSIS — R059 Cough, unspecified: Secondary | ICD-10-CM

## 2018-04-29 DIAGNOSIS — E876 Hypokalemia: Secondary | ICD-10-CM | POA: Diagnosis not present

## 2018-07-11 ENCOUNTER — Other Ambulatory Visit: Payer: Self-pay | Admitting: Obstetrics and Gynecology

## 2018-07-11 DIAGNOSIS — Z1231 Encounter for screening mammogram for malignant neoplasm of breast: Secondary | ICD-10-CM

## 2018-07-11 DIAGNOSIS — K2960 Other gastritis without bleeding: Secondary | ICD-10-CM | POA: Diagnosis not present

## 2018-07-17 DIAGNOSIS — H402211 Chronic angle-closure glaucoma, right eye, mild stage: Secondary | ICD-10-CM | POA: Diagnosis not present

## 2018-07-17 DIAGNOSIS — H2511 Age-related nuclear cataract, right eye: Secondary | ICD-10-CM | POA: Diagnosis not present

## 2018-07-17 DIAGNOSIS — H402222 Chronic angle-closure glaucoma, left eye, moderate stage: Secondary | ICD-10-CM | POA: Diagnosis not present

## 2018-07-17 DIAGNOSIS — H02883 Meibomian gland dysfunction of right eye, unspecified eyelid: Secondary | ICD-10-CM | POA: Diagnosis not present

## 2018-08-14 ENCOUNTER — Ambulatory Visit
Admission: RE | Admit: 2018-08-14 | Discharge: 2018-08-14 | Disposition: A | Discharge status: Home / Self Care | Payer: BLUE CROSS/BLUE SHIELD | Source: Ambulatory Visit | Attending: Obstetrics and Gynecology | Admitting: Obstetrics and Gynecology

## 2018-08-14 DIAGNOSIS — Z1231 Encounter for screening mammogram for malignant neoplasm of breast: Secondary | ICD-10-CM | POA: Diagnosis not present

## 2018-10-02 DIAGNOSIS — N390 Urinary tract infection, site not specified: Secondary | ICD-10-CM | POA: Diagnosis not present

## 2018-10-02 DIAGNOSIS — G43909 Migraine, unspecified, not intractable, without status migrainosus: Secondary | ICD-10-CM | POA: Diagnosis not present

## 2018-10-02 DIAGNOSIS — I10 Essential (primary) hypertension: Secondary | ICD-10-CM | POA: Diagnosis not present

## 2018-11-13 DIAGNOSIS — R21 Rash and other nonspecific skin eruption: Secondary | ICD-10-CM | POA: Diagnosis not present

## 2018-11-13 DIAGNOSIS — I10 Essential (primary) hypertension: Secondary | ICD-10-CM | POA: Diagnosis not present

## 2018-11-20 DIAGNOSIS — H402211 Chronic angle-closure glaucoma, right eye, mild stage: Secondary | ICD-10-CM | POA: Diagnosis not present

## 2018-11-20 DIAGNOSIS — H402222 Chronic angle-closure glaucoma, left eye, moderate stage: Secondary | ICD-10-CM | POA: Diagnosis not present

## 2018-11-20 DIAGNOSIS — Z961 Presence of intraocular lens: Secondary | ICD-10-CM | POA: Diagnosis not present

## 2018-11-23 DIAGNOSIS — L42 Pityriasis rosea: Secondary | ICD-10-CM | POA: Diagnosis not present

## 2018-12-12 DIAGNOSIS — I10 Essential (primary) hypertension: Secondary | ICD-10-CM | POA: Diagnosis not present

## 2018-12-12 DIAGNOSIS — G43909 Migraine, unspecified, not intractable, without status migrainosus: Secondary | ICD-10-CM | POA: Diagnosis not present

## 2018-12-12 DIAGNOSIS — Z6828 Body mass index (BMI) 28.0-28.9, adult: Secondary | ICD-10-CM | POA: Diagnosis not present

## 2018-12-13 DIAGNOSIS — Z6827 Body mass index (BMI) 27.0-27.9, adult: Secondary | ICD-10-CM | POA: Diagnosis not present

## 2018-12-13 DIAGNOSIS — Z01419 Encounter for gynecological examination (general) (routine) without abnormal findings: Secondary | ICD-10-CM | POA: Diagnosis not present

## 2018-12-21 ENCOUNTER — Encounter (HOSPITAL_COMMUNITY): Payer: Self-pay

## 2018-12-21 ENCOUNTER — Ambulatory Visit (HOSPITAL_COMMUNITY)
Admission: EM | Admit: 2018-12-21 | Discharge: 2018-12-21 | Disposition: A | Discharge status: Home / Self Care | Payer: BLUE CROSS/BLUE SHIELD | Source: Home / Self Care

## 2018-12-21 ENCOUNTER — Other Ambulatory Visit: Payer: Self-pay

## 2018-12-21 ENCOUNTER — Observation Stay (HOSPITAL_COMMUNITY)
Admission: EM | Admit: 2018-12-21 | Discharge: 2018-12-22 | Disposition: A | Discharge status: Home / Self Care | Payer: BLUE CROSS/BLUE SHIELD | Attending: Internal Medicine | Admitting: Internal Medicine

## 2018-12-21 ENCOUNTER — Emergency Department (HOSPITAL_COMMUNITY): Payer: BLUE CROSS/BLUE SHIELD

## 2018-12-21 DIAGNOSIS — Z7982 Long term (current) use of aspirin: Secondary | ICD-10-CM | POA: Diagnosis not present

## 2018-12-21 DIAGNOSIS — K219 Gastro-esophageal reflux disease without esophagitis: Secondary | ICD-10-CM | POA: Diagnosis present

## 2018-12-21 DIAGNOSIS — I10 Essential (primary) hypertension: Secondary | ICD-10-CM | POA: Diagnosis present

## 2018-12-21 DIAGNOSIS — R0789 Other chest pain: Principal | ICD-10-CM | POA: Insufficient documentation

## 2018-12-21 DIAGNOSIS — M25473 Effusion, unspecified ankle: Secondary | ICD-10-CM | POA: Diagnosis present

## 2018-12-21 DIAGNOSIS — Z79899 Other long term (current) drug therapy: Secondary | ICD-10-CM | POA: Diagnosis not present

## 2018-12-21 DIAGNOSIS — F329 Major depressive disorder, single episode, unspecified: Secondary | ICD-10-CM | POA: Insufficient documentation

## 2018-12-21 DIAGNOSIS — R079 Chest pain, unspecified: Secondary | ICD-10-CM | POA: Diagnosis not present

## 2018-12-21 DIAGNOSIS — J986 Disorders of diaphragm: Secondary | ICD-10-CM | POA: Diagnosis not present

## 2018-12-21 DIAGNOSIS — R2240 Localized swelling, mass and lump, unspecified lower limb: Secondary | ICD-10-CM | POA: Diagnosis not present

## 2018-12-21 HISTORY — DX: Essential (primary) hypertension: I10

## 2018-12-21 LAB — BASIC METABOLIC PANEL
Anion gap: 8 (ref 5–15)
BUN: 6 mg/dL (ref 6–20)
CO2: 24 mmol/L (ref 22–32)
Calcium: 9.3 mg/dL (ref 8.9–10.3)
Chloride: 108 mmol/L (ref 98–111)
Creatinine, Ser: 0.68 mg/dL (ref 0.44–1.00)
GFR calc Af Amer: >60 mL/min (ref >60)
GFR calc non Af Amer: >60 mL/min (ref >60)
GLUCOSE: 124 mg/dL — AB (ref 70–99)
Potassium: 3.5 mmol/L (ref 3.5–5.1)
Sodium: 140 mmol/L (ref 135–145)

## 2018-12-21 LAB — CBC
HEMATOCRIT: 42 % (ref 36.0–46.0)
Hemoglobin: 13.8 g/dL (ref 12.0–15.0)
MCH: 29.1 pg (ref 26.0–34.0)
MCHC: 32.9 g/dL (ref 30.0–36.0)
MCV: 88.4 fL (ref 80.0–100.0)
Platelets: 258 10*3/uL (ref 150–400)
RBC: 4.75 MIL/uL (ref 3.87–5.11)
RDW: 12.9 % (ref 11.5–15.5)
WBC: 4.4 10*3/uL (ref 4.0–10.5)
nRBC: 0 % (ref 0.0–0.2)

## 2018-12-21 LAB — I-STAT BETA HCG BLOOD, ED (MC, WL, AP ONLY): I-stat hCG, quantitative: 6.2 m[IU]/mL — ABNORMAL HIGH (ref <5)

## 2018-12-21 LAB — TROPONIN I
Troponin I: <0.03 ng/mL (ref <0.03)
Troponin I: <0.03 ng/mL (ref <0.03)
Troponin I: <0.03 ng/mL (ref <0.03)

## 2018-12-21 MED ORDER — AMITRIPTYLINE HCL 25 MG PO TABS
25.0000 mg | ORAL_TABLET | Freq: Every day | ORAL | Status: DC
  Administered 2018-12-21: 25 mg via ORAL
  Filled 2018-12-21: qty 1

## 2018-12-21 MED ORDER — ASPIRIN EC 81 MG PO TBEC
81.0000 mg | DELAYED_RELEASE_TABLET | ORAL | Status: DC
  Administered 2018-12-21: 81 mg via ORAL
  Filled 2018-12-21 (×2): qty 1

## 2018-12-21 MED ORDER — AMLODIPINE BESYLATE 5 MG PO TABS
5.0000 mg | ORAL_TABLET | Freq: Every day | ORAL | Status: DC
  Administered 2018-12-21: 5 mg via ORAL
  Filled 2018-12-21: qty 1

## 2018-12-21 MED ORDER — PANTOPRAZOLE SODIUM 40 MG PO TBEC
40.0000 mg | DELAYED_RELEASE_TABLET | Freq: Every day | ORAL | Status: DC
  Administered 2018-12-22: 40 mg via ORAL
  Filled 2018-12-21: qty 1

## 2018-12-21 MED ORDER — DORZOLAMIDE HCL-TIMOLOL MAL 2-0.5 % OP SOLN
1.0000 [drp] | Freq: Two times a day (BID) | OPHTHALMIC | Status: DC
  Administered 2018-12-21 – 2018-12-22 (×2): 1 [drp] via OPHTHALMIC
  Filled 2018-12-21: qty 10

## 2018-12-21 MED ORDER — IRBESARTAN 150 MG PO TABS
150.0000 mg | ORAL_TABLET | Freq: Every day | ORAL | Status: DC
  Administered 2018-12-21 – 2018-12-22 (×2): 150 mg via ORAL
  Filled 2018-12-21 (×2): qty 1

## 2018-12-21 MED ORDER — LATANOPROST 0.005 % OP SOLN
1.0000 [drp] | Freq: Every day | OPHTHALMIC | Status: DC
  Administered 2018-12-21: 1 [drp] via OPHTHALMIC
  Filled 2018-12-21: qty 2.5

## 2018-12-21 MED ORDER — ASPIRIN EC 81 MG PO TBEC: 81.0000 mg | DELAYED_RELEASE_TABLET | ORAL | Status: DC

## 2018-12-21 MED ORDER — ONDANSETRON HCL 4 MG/2ML IJ SOLN: 4.0000 mg | Freq: Four times a day (QID) | INTRAMUSCULAR | Status: DC | PRN

## 2018-12-21 MED ORDER — ACETAMINOPHEN 325 MG PO TABS
650.0000 mg | ORAL_TABLET | ORAL | Status: DC | PRN
  Administered 2018-12-21: 650 mg via ORAL

## 2018-12-21 MED ORDER — ENOXAPARIN SODIUM 40 MG/0.4ML ~~LOC~~ SOLN
40.0000 mg | SUBCUTANEOUS | Status: DC
  Administered 2018-12-21: 40 mg via SUBCUTANEOUS
  Filled 2018-12-21: qty 0.4

## 2018-12-21 MED ORDER — VITAMIN D 25 MCG (1000 UNIT) PO TABS
1000.0000 [IU] | ORAL_TABLET | Freq: Every day | ORAL | Status: DC
  Administered 2018-12-22: 1000 [IU] via ORAL
  Filled 2018-12-21: qty 1

## 2018-12-21 NOTE — ED Notes (Signed)
ED TO INPATIENT HANDOFF REPORT  ED Nurse Name and Phone #: Faolan [place] [phone]  S Name/Age/Gender Demaris Callander 53 y.o. female Room/Bed: H013C/H013C  Code Status   Code Status: Not on file  Home/SNF/Other Home Patient oriented to: self, place, time and situation Is this baseline? Yes   Triage Complete: Triage complete  Chief Complaint cp  Triage Note Pt endorses chest heaviness, bilateral lower leg swelling and arm tingling x 2 days. Axox4. Hx htn. VSS.    Allergies Allergies  Allergen Reactions  . Augmentin [Amoxicillin-Pot Clavulanate] Diarrhea and Nausea And Vomiting  . Codeine Nausea And Vomiting  . Adhesive [Tape] Rash    Level of Care/Admitting Diagnosis ED Disposition    ED Disposition Condition Comment   Admit  Hospital Area: [place] [[id number]]  Level of Care: Cardiac Telemetry [103]  I expect the patient will be discharged within 24 hours: Yes  LOW acuity---Tx typically complete <24 hrs---ACUTE conditions typically can be evaluated <24 hours---LABS likely to return to acceptable levels <24 hours---IS near functional baseline---EXPECTED to return to current living arrangement---NOT newly hypoxic: Meets criteria for 5C-Observation unit  Diagnosis: Chest pain [188416]  Admitting Physician: Barb Merino [[id number]]  Attending Physician: Barb Merino [[id number]]  PT Class (Do Not Modify): Observation [104]  PT Acc Code (Do Not Modify): Observation [10022]       B Medical/Surgery History Past Medical History:  Diagnosis Date  . Abdominal pain OCT 2011 NL CBC, HFP, LIPASE   MRCP-SEPTATED GB, NL CBD, R LOBE HEMANGIOMA  . Aneurysm ([place]) 2009 COILING   stent placed  . Chronic constipation   . Dysrhythmia    "irregular heartbeat"  . GERD (gastroesophageal reflux disease)   . Glaucoma   . Hemorrhoids, internal, with bleeding DEC 2010 TCS  . Migraines   . PONV (postoperative nausea and vomiting)   . Small intestinal bacterial overgrowth  NOV 2015   HBT PEAK 72(135 MINS) TROUGH 55 (150 MINS), PEAK 71(165 MINS) TROUGH 29 (180 MINS)   Past Surgical History:  Procedure Laterality Date  . anuerysm repair     coil  . APPENDECTOMY    . BACTERIAL OVERGROWTH TEST N/A 09/02/2014   POSITIVE  . CHOLECYSTECTOMY  12/06/2011   Procedure: LAPAROSCOPIC CHOLECYSTECTOMY;  Surgeon: Donato Heinz, MD;  Location: [facility];  Service: General;  Laterality: N/A;  . COLONOSCOPY     DEC 2010 IH  . ESOPHAGOGASTRODUODENOSCOPY  09/17/2011   Dr. Oneida Alar :moderate gastritis  . ESOPHAGOGASTRODUODENOSCOPY N/A 11/23/2013   Dr. Theophilus Bones Gastritis in the body and the antrum of the stomach/ SINGLE diverticulum in the second portion of the duodenum, mild chronic gastritis on path. Negative H.pylori. Pursue HBT if persistent issues.   Marland Kitchen EYE SURGERY     glaucoma-unsure if both or just one of them  . GLAUCOMA SURGERY  MAR 2012  . IR RADIOLOGIST EVAL & MGMT  12/29/2017  . TUBAL LIGATION    . UPPER GASTROINTESTINAL ENDOSCOPY     OCT 2011 RING DILATED TO 16MM, CANDIDA ESOPHAGITIS, CHRONIC GASTRITIS     A IV Location/Drains/Wounds Patient Lines/Drains/Airways Status   Active Line/Drains/Airways    Name:   Placement date:   Placement time:   Site:   Days:   Peripheral IV 12/21/18 Right Antecubital   12/21/18    1028    Antecubital   less than 1   Incision 12/06/11 Abdomen Bilateral   12/06/11    1221     2572   Incision 05/12/12 Groin Right  05/12/12    0940     2414   Incision - 4 Ports Abdomen 1: Right;Lateral 2: Mid;Upper 3: Umbilicus 4: Left;Medial   12/06/11    0820     2572          Intake/Output Last 24 hours No intake or output data in the 24 hours ending 12/21/18 1408  Labs/Imaging Results for orders placed or performed during the hospital encounter of 12/21/18 (from the past 48 hour(s))  Basic metabolic panel     Status: Abnormal   Collection Time: 12/21/18 10:30 AM  Result Value Ref Range   Sodium 140 135 - 145 mmol/L   Potassium  3.5 3.5 - 5.1 mmol/L   Chloride 108 98 - 111 mmol/L   CO2 24 22 - 32 mmol/L   Glucose, Bld 124 (H) 70 - 99 mg/dL   BUN 6 6 - 20 mg/dL   Creatinine, Ser 0.68 0.44 - 1.00 mg/dL   Calcium 9.3 8.9 - 10.3 mg/dL   GFR calc non Af Amer >60 >60 mL/min   GFR calc Af Amer >60 >60 mL/min   Anion gap 8 5 - 15    Comment: Performed at [facility], [place] [address] [place], [place] [address]  CBC     Status: None   Collection Time: 12/21/18 10:30 AM  Result Value Ref Range   WBC 4.4 4.0 - 10.5 K/uL   RBC 4.75 3.87 - 5.11 MIL/uL   Hemoglobin 13.8 12.0 - 15.0 g/dL   HCT 42.0 36.0 - 46.0 %   MCV 88.4 80.0 - 100.0 fL   MCH 29.1 26.0 - 34.0 pg   MCHC 32.9 30.0 - 36.0 g/dL   RDW 12.9 11.5 - 15.5 %   Platelets 258 150 - 400 K/uL   nRBC 0.0 0.0 - 0.2 %    Comment: Performed at [facility], [place] [address], [place], [place] [address]  Troponin I - ONCE - STAT     Status: None   Collection Time: 12/21/18 10:30 AM  Result Value Ref Range   Troponin I <0.03 <0.03 ng/mL    Comment: Performed at [facility] [address] [place], [place] [address]  I-Stat beta hCG blood, ED     Status: Abnormal   Collection Time: 12/21/18 10:33 AM  Result Value Ref Range   I-stat hCG, quantitative 6.2 (H) <5 mIU/mL   Comment 3            Comment:   GEST. AGE      CONC.  (mIU/mL)   <=1 WEEK        5 - 50     2 WEEKS       50 - 500     3 WEEKS       100 - 10,000     4 WEEKS     1,000 - 30,000        FEMALE AND NON-PREGNANT FEMALE:     LESS THAN 5 mIU/mL    Dg Chest 2 View  Result Date: 12/21/2018 CLINICAL DATA:  Chest tightness EXAM: CHEST - 2 VIEW COMPARISON:  03/30/2018 FINDINGS: Peribronchial thickening. Stable mild elevation of the left hemidiaphragm. Heart and mediastinal contours are within normal limits. No focal opacities or effusions. No acute bony abnormality. IMPRESSION: Mild bronchitic changes. Electronically Signed   By: Rolm Baptise M.D.   On: 12/21/2018 11:36    Pending  Labs Unresulted Labs (From admission, onward)  Start     Ordered   12/21/18 1303  Troponin I - Now Then Q6H  Now then every 6 hours,   R     12/21/18 1302   Signed and Held  HIV antibody (Routine Testing)  Once,   R     Signed and Held          Vitals/Pain Today's Vitals   12/21/18 1030 12/21/18 1045 12/21/18 1100 12/21/18 1115  BP: (!) 143/68 123/64 125/63 125/69  Pulse: 71 70 68 70  Resp: 13 16 15 13   SpO2: 100% 98% 98% 97%  Weight:      Height:      PainSc:        Isolation Precautions No active isolations  Medications Medications - No data to display  Mobility walks Low fall risk   Focused Assessments Cardiac Assessment Handoff:  Cardiac Rhythm: Normal sinus rhythm Lab Results  Component Value Date   TROPONINI <0.03 12/21/2018   No results found for: DDIMER Does the Patient currently have chest pain? No     R Recommendations: See Admitting Provider Note  Report given to:   Additional Notes:

## 2018-12-21 NOTE — ED Triage Notes (Signed)
Pt presents with complaints of heaviness in her chest x 2 days, swelling in her feet and tingling in both arms. She has been adjusting her blood pressure medication over the last week with her pcp

## 2018-12-21 NOTE — ED Notes (Signed)
Dr. Mannie Stabile at bedside

## 2018-12-21 NOTE — ED Triage Notes (Signed)
Pt endorses chest heaviness, bilateral lower leg swelling and arm tingling x 2 days. Axox4. Hx htn. VSS.

## 2018-12-21 NOTE — Plan of Care (Signed)
  Problem: Education: Goal: Knowledge of General Education information will improve Description Including pain rating scale, medication(s)/side effects and non-pharmacologic comfort measures Outcome: Progressing   

## 2018-12-21 NOTE — ED Provider Notes (Signed)
[facility] EMERGENCY DEPARTMENT Provider Note   CSN: [id number] Arrival date & time: 12/21/18  1012    History   Chief Complaint Chief Complaint  Patient presents with  . Chest Pain    HPI [name] is a 53 y.o. female.  HPI: A 53 year old patient with a history of hypertension presents for evaluation of chest pain. Initial onset of pain was more than 6 hours ago. The patient's chest pain is described as heaviness/pressure/tightness and is not worse with exertion. The patient's chest pain is middle- or left-sided, is not well-localized, is not sharp and does not radiate to the arms/jaw/neck. The patient does not complain of nausea and denies diaphoresis. The patient has no history of stroke, has no history of peripheral artery disease, has not smoked in the past 90 days, denies any history of treated diabetes, has no relevant family history of coronary artery disease (first degree relative at less than age 72), has no history of hypercholesterolemia and does not have an elevated BMI (>=30).   HPI Patient presents with chest pain.  Has had for last couple days.  Dull in her mid chest.  Some shortness of breath.  No fevers.  Rare cough.  Has some swelling in her legs.  Recently had an increase of her amlodipine by her PCP, Dr. Willey Blade.  No known ischemic heart disease but has had hypertension.  No unilateral leg swelling.  Does not smoke.  Pain in her chest as a pressure. Past Medical History:  Diagnosis Date  . Abdominal pain OCT 2011 NL CBC, HFP, LIPASE   MRCP-SEPTATED GB, NL CBD, R LOBE HEMANGIOMA  . Aneurysm ([place]) 2009 COILING   stent placed  . Chronic constipation   . Dysrhythmia    "irregular heartbeat"  . GERD (gastroesophageal reflux disease)   . Glaucoma   . Hemorrhoids, internal, with bleeding DEC 2010 TCS  . Migraines   . PONV (postoperative nausea and vomiting)   . Small intestinal bacterial overgrowth NOV 2015   HBT PEAK 72(135 MINS) TROUGH 55  (150 MINS), PEAK 71(165 MINS) TROUGH 29 (180 MINS)    Patient Active Problem List   Diagnosis Date Noted  . Bacterial overgrowth syndrome 01/30/2015  . IBS (irritable bowel syndrome) 08/22/2014  . Abdominal pain, epigastric 11/21/2013  . Migraine 11/18/2011  . Dysphagia 01/28/2011  . GERD (gastroesophageal reflux disease) 01/28/2011  . Hemangioma of liver 07/24/2010  . ABDOMINAL PAIN 07/24/2010  . NONSPECIFIC ABN FINDNG RAD&OTH EXAM BILARY TRCT 07/24/2010  . HEMORRHOIDS 11/19/2009  . Constipation 09/26/2009    Past Surgical History:  Procedure Laterality Date  . anuerysm repair     coil  . APPENDECTOMY    . BACTERIAL OVERGROWTH TEST N/A 09/02/2014   POSITIVE  . CHOLECYSTECTOMY  12/06/2011   Procedure: LAPAROSCOPIC CHOLECYSTECTOMY;  Surgeon: Donato Heinz, MD;  Location: [facility];  Service: General;  Laterality: N/A;  . COLONOSCOPY     DEC 2010 IH  . ESOPHAGOGASTRODUODENOSCOPY  09/17/2011   Dr. Oneida Alar :moderate gastritis  . ESOPHAGOGASTRODUODENOSCOPY N/A 11/23/2013   Dr. Theophilus Bones Gastritis in the body and the antrum of the stomach/ SINGLE diverticulum in the second portion of the duodenum, mild chronic gastritis on path. Negative H.pylori. Pursue HBT if persistent issues.   Marland Kitchen EYE SURGERY     glaucoma-unsure if both or just one of them  . GLAUCOMA SURGERY  MAR 2012  . IR RADIOLOGIST EVAL & MGMT  12/29/2017  . TUBAL LIGATION    .  UPPER GASTROINTESTINAL ENDOSCOPY     OCT 2011 RING DILATED TO 16MM, CANDIDA ESOPHAGITIS, CHRONIC GASTRITIS     OB History   No obstetric history on file.      Home Medications    Prior to Admission medications   Medication Sig Start Date End Date Taking? Authorizing Provider  amitriptyline (ELAVIL) 75 MG tablet Take 1 tablet (75 mg total) by mouth at bedtime. Patient taking differently: Take 25 mg by mouth at bedtime.  03/20/16  Yes Tereasa Coop, PA-C  amLODipine (NORVASC) 5 MG tablet Take 5 mg by mouth at bedtime.  11/13/18  Yes  [provider]  aspirin EC 81 MG tablet Take 81 mg by mouth every other day.   Yes [provider]  cholecalciferol (VITAMIN D3) 25 MCG (1000 UT) tablet Take 1,000 mg by mouth daily.   Yes [provider]  dorzolamide-timolol (COSOPT) 22.3-6.8 MG/ML ophthalmic solution Place 1 drop into the left eye 2 (two) times daily.     Yes [provider]  latanoprost (XALATAN) 0.005 % ophthalmic solution Place 1 drop into the right eye at bedtime.    Yes [provider]  Omeprazole (PRILOSEC PO) Take by mouth.   Yes [provider]  polyethylene glycol powder (GLYCOLAX/MIRALAX) powder Take 17 g by mouth at bedtime.    Yes [provider]  valsartan (DIOVAN) 160 MG tablet Take 160 mg by mouth at bedtime. 10/03/18  Yes [provider]  benzonatate (TESSALON) 100 MG capsule Take 1-2 capsules (100-200 mg total) by mouth 3 (three) times daily as needed for cough. Patient not taking: Reported on 12/21/2018 03/14/18   Rutherford Guys, MD  guaiFENesin (ROBITUSSIN) 100 MG/5ML SOLN Take 10 mLs (200 mg total) by mouth every 6 (six) hours as needed for cough or to loosen phlegm. Patient not taking: Reported on 12/21/2018 03/16/18   Rutherford Guys, MD  ipratropium (ATROVENT) 0.03 % nasal spray Place 1 spray into both nostrils 2 (two) times daily. Patient not taking: Reported on 12/21/2018 03/14/18   Rutherford Guys, MD    Family History Family History  Problem Relation Age of Onset  . Heart disease Mother   . Diabetes Maternal Grandmother   . Stroke Paternal Grandmother   . Heart disease Paternal Grandfather   . Breast cancer Cousin   . Anesthesia problems Neg Hx   . Hypotension Neg Hx   . Malignant hyperthermia Neg Hx   . Pseudochol deficiency Neg Hx     Social History Social History   Tobacco Use  . Smoking status: Never Smoker  . Smokeless tobacco: Never Used  Substance Use Topics  . Alcohol use: No  . Drug use: No      Allergies   Augmentin [amoxicillin-pot clavulanate]; Codeine; and Adhesive [tape]   Review of Systems Review of Systems  Constitutional: Negative for appetite change.  HENT: Negative for congestion.   Respiratory: Positive for shortness of breath.   Cardiovascular: Positive for chest pain and leg swelling.  Gastrointestinal: Negative for abdominal pain.  Genitourinary: Negative for flank pain.  Musculoskeletal: Negative for back pain.  Skin: Negative for rash.  Neurological: Negative for weakness.  Psychiatric/Behavioral: Negative for confusion.     Physical Exam Updated Vital Signs BP 125/69   Pulse 70   Resp 13   Ht 5\' 6"  (1.676 m)   Wt 77.1 kg   LMP 08/09/2015 (Within Days) Comment: pt. having irregular period, perimenopausal  SpO2 97%   BMI 27.44 kg/m  Physical Exam HENT:     Head: Normocephalic.  Neck:     Musculoskeletal: Normal range of motion.  Cardiovascular:     Rate and Rhythm: Normal rate and regular rhythm.     Heart sounds: Heart sounds not distant. No murmur.  Pulmonary:     Effort: No tachypnea.  Abdominal:     Palpations: Abdomen is soft.  Musculoskeletal:     Right lower leg: Edema present.     Left lower leg: Edema present.  Skin:    General: Skin is warm.     Capillary Refill: Capillary refill takes less than 2 seconds.  Neurological:     General: No focal deficit present.     Mental Status: She is alert.      ED Treatments / Results  Labs (all labs ordered are listed, but only abnormal results are displayed) Labs Reviewed  BASIC METABOLIC PANEL - Abnormal; Notable for the following components:      Result Value   Glucose, Bld 124 (*)    All other components within normal limits  I-STAT BETA HCG BLOOD, ED ([facility], [facility], [facility] ONLY) - Abnormal; Notable for the following components:   I-stat hCG, quantitative 6.2 (*)    All other components within normal limits  CBC  TROPONIN I    EKG EKG Interpretation  Date/Time:  Thursday  December 21 2018 10:27:06 EST Ventricular Rate:  75 PR Interval:    QRS Duration: 119 QT Interval:  422 QTC Calculation: 472 R Axis:   42 Text Interpretation:  Sinus rhythm Sinus pause Nonspecific intraventricular conduction delay Borderline repol abnrm, anterolateral leads Baseline wander in lead(s) V3 V5 V6 Confirmed by Davonna Belling [phone]) on 12/21/2018 10:47:59 AM   Radiology Dg Chest 2 View  Result Date: 12/21/2018 CLINICAL DATA:  Chest tightness EXAM: CHEST - 2 VIEW COMPARISON:  03/30/2018 FINDINGS: Peribronchial thickening. Stable mild elevation of the left hemidiaphragm. Heart and mediastinal contours are within normal limits. No focal opacities or effusions. No acute bony abnormality. IMPRESSION: Mild bronchitic changes. Electronically Signed   By: Rolm Baptise M.D.   On: 12/21/2018 11:36    Procedures Procedures (including critical care time)  Medications Ordered in ED Medications - No data to display   Initial Impression / Assessment and Plan / ED Course  I have reviewed the triage vital signs and the nursing notes.  Pertinent labs & imaging results that were available during my care of the patient were reviewed by me and considered in my medical decision making (see chart for details).     HEAR Score: 4  Patient presents with chest pain.  Over the last 2 days.  Dull in the mid chest.  More swelling the legs.  No real cough.  Bronchitis on x-ray but does not have cough or lung findings.  EKG nonspecific but reassuring.  Enzymes negative but patient has a heart score of 4.  Will discuss with hospitalist about admission.  Final Clinical Impressions(s) / ED Diagnoses   Final diagnoses:  Chest pain, unspecified type    ED Discharge Orders    None       Davonna Belling, MD 12/21/18 1215

## 2018-12-21 NOTE — H&P (Signed)
History and Physical    [name] KGY:[id number] DOB: 1965-12-30 DOA: 12/21/2018  PCP: Asencion Noble, MD  Patient coming from: home   I have personally briefly reviewed patient's old medical records available.   Chief Complaint: chest discomfort   HPI: [name] is a 53 y.o. female with medical history significant of hypertension, chronic constipation who is presenting to the emergency room with 2 days of chest discomfort.  According to the patient, she does not have any history of coronary artery disease.  She does not have any history of asthma or COPD.  Recently has been having trouble with blood pressure control.  She was on 10 mg of amlodipine, she developed bilateral leg swelling and her doctor decreased the dose to 5 mg.  About 2 days ago she started having vague chest discomfort, mostly on the anterior chest, pressure sensation, intermittent, no exacerbating or relieving factors and occasional shortness of breath but no nausea vomiting or diaphoresis.  She was very anxious with this symptoms, her sister recently had sudden death so she got very worried.  She called her PCP who was not in town so came to the ER. ED Course: Hemodynamically stable.  Point-of-care troponin normal.  Twelve-lead EKG nonischemic.  Chest x-ray shows some chronic bronchitis changes, nonspecific.  Patient does not have any pulmonary symptoms.  Review of Systems: As per HPI otherwise 10 point review of systems negative.    Past Medical History:  Diagnosis Date  . Abdominal pain OCT 2011 NL CBC, HFP, LIPASE   MRCP-SEPTATED GB, NL CBD, R LOBE HEMANGIOMA  . Aneurysm ([place]) 2009 COILING   stent placed  . Chronic constipation   . Dysrhythmia    "irregular heartbeat"  . GERD (gastroesophageal reflux disease)   . Glaucoma   . Hemorrhoids, internal, with bleeding DEC 2010 TCS  . Migraines   . PONV (postoperative nausea and vomiting)   . Small intestinal bacterial overgrowth NOV 2015   HBT PEAK 72(135  MINS) TROUGH 55 (150 MINS), PEAK 71(165 MINS) TROUGH 29 (180 MINS)    Past Surgical History:  Procedure Laterality Date  . anuerysm repair     coil  . APPENDECTOMY    . BACTERIAL OVERGROWTH TEST N/A 09/02/2014   POSITIVE  . CHOLECYSTECTOMY  12/06/2011   Procedure: LAPAROSCOPIC CHOLECYSTECTOMY;  Surgeon: Donato Heinz, MD;  Location: [facility];  Service: General;  Laterality: N/A;  . COLONOSCOPY     DEC 2010 IH  . ESOPHAGOGASTRODUODENOSCOPY  09/17/2011   Dr. Oneida Alar :moderate gastritis  . ESOPHAGOGASTRODUODENOSCOPY N/A 11/23/2013   Dr. Theophilus Bones Gastritis in the body and the antrum of the stomach/ SINGLE diverticulum in the second portion of the duodenum, mild chronic gastritis on path. Negative H.pylori. Pursue HBT if persistent issues.   [name] EYE SURGERY     glaucoma-unsure if both or just one of them  . GLAUCOMA SURGERY  MAR 2012  . IR RADIOLOGIST EVAL & MGMT  12/29/2017  . TUBAL LIGATION    . UPPER GASTROINTESTINAL ENDOSCOPY     OCT 2011 RING DILATED TO 16MM, CANDIDA ESOPHAGITIS, CHRONIC GASTRITIS     reports that she has never smoked. She has never used smokeless tobacco. She reports that she does not drink alcohol or use drugs.  Allergies  Allergen Reactions  . Augmentin [Amoxicillin-Pot Clavulanate] Diarrhea and Nausea And Vomiting  . Codeine Nausea And Vomiting  . Adhesive [Tape] Rash    Family History  Problem Relation Age of Onset  . Heart disease  Mother   . Diabetes Maternal Grandmother   . Stroke Paternal Grandmother   . Heart disease Paternal Grandfather   . Breast cancer Cousin   . Anesthesia problems Neg Hx   . Hypotension Neg Hx   . Malignant hyperthermia Neg Hx   . Pseudochol deficiency Neg Hx      Prior to Admission medications   Medication Sig Start Date End Date Taking? Authorizing Provider  amitriptyline (ELAVIL) 75 MG tablet Take 1 tablet (75 mg total) by mouth at bedtime. Patient taking differently: Take 25 mg by mouth at bedtime.  03/20/16  Yes  Tereasa Coop, PA-C  amLODipine (NORVASC) 5 MG tablet Take 5 mg by mouth at bedtime.  11/13/18  Yes [provider]  aspirin EC 81 MG tablet Take 81 mg by mouth every other day.   Yes [provider]  cholecalciferol (VITAMIN D3) 25 MCG (1000 UT) tablet Take 1,000 mg by mouth daily.   Yes [provider]  dorzolamide-timolol (COSOPT) 22.3-6.8 MG/ML ophthalmic solution Place 1 drop into the left eye 2 (two) times daily.     Yes [provider]  latanoprost (XALATAN) 0.005 % ophthalmic solution Place 1 drop into the right eye at bedtime.    Yes [provider]  Omeprazole (PRILOSEC PO) Take by mouth.   Yes [provider]  polyethylene glycol powder (GLYCOLAX/MIRALAX) powder Take 17 g by mouth at bedtime.    Yes [provider]  valsartan (DIOVAN) 160 MG tablet Take 160 mg by mouth at bedtime. 10/03/18  Yes [provider]    Physical Exam: Vitals:   12/21/18 1030 12/21/18 1045 12/21/18 1100 12/21/18 1115  BP: (!) 143/68 123/64 125/63 125/69  Pulse: 71 70 68 70  Resp: 13 16 15 13   SpO2: 100% 98% 98% 97%  Weight:      Height:        Constitutional: NAD, calm, comfortable Vitals:   12/21/18 1030 12/21/18 1045 12/21/18 1100 12/21/18 1115  BP: (!) 143/68 123/64 125/63 125/69  Pulse: 71 70 68 70  Resp: 13 16 15 13   SpO2: 100% 98% 98% 97%  Weight:      Height:       Eyes: PERRL, lids and conjunctivae normal ENMT: Mucous membranes are moist. Posterior pharynx clear of any exudate or lesions.Normal dentition.  Neck: normal, supple, no masses, no thyromegaly Respiratory: clear to auscultation bilaterally, no wheezing, no crackles. Normal respiratory effort. No accessory muscle use.  Cardiovascular: Regular rate and rhythm, no murmurs / rubs / gallops.  1+ extremity edema. 2+ pedal pulses. No carotid bruits.  Abdomen: no tenderness, no masses palpated. No hepatosplenomegaly. Bowel sounds positive.  Musculoskeletal: no  clubbing / cyanosis. No joint deformity upper and lower extremities. Good ROM, no contractures. Normal muscle tone.  Skin: no rashes, lesions, ulcers. No induration Neurologic: CN 2-12 grossly intact. Sensation intact, DTR normal. Strength 5/5 in all 4.  Psychiatric: Normal judgment and insight. Alert and oriented x 3. Normal mood.     Labs on Admission: I have personally reviewed following labs and imaging studies  CBC: Recent Labs  Lab 12/21/18 1030  WBC 4.4  HGB 13.8  HCT 42.0  MCV 88.4  PLT 024   Basic Metabolic Panel: Recent Labs  Lab 12/21/18 1030  NA 140  K 3.5  CL 108  CO2 24  GLUCOSE 124*  BUN 6  CREATININE 0.68  CALCIUM 9.3   GFR: Estimated Creatinine Clearance: 84.3 mL/min (by C-G formula based  on SCr of 0.68 mg/dL). Liver Function Tests: No results for input(s): AST, ALT, ALKPHOS, BILITOT, PROT, ALBUMIN in the last 168 hours. No results for input(s): LIPASE, AMYLASE in the last 168 hours. No results for input(s): AMMONIA in the last 168 hours. Coagulation Profile: No results for input(s): INR, PROTIME in the last 168 hours. Cardiac Enzymes: Recent Labs  Lab 12/21/18 1030  TROPONINI <0.03   BNP (last 3 results) No results for input(s): PROBNP in the last 8760 hours. HbA1C: No results for input(s): HGBA1C in the last 72 hours. CBG: No results for input(s): GLUCAP in the last 168 hours. Lipid Profile: No results for input(s): CHOL, HDL, LDLCALC, TRIG, CHOLHDL, LDLDIRECT in the last 72 hours. Thyroid Function Tests: No results for input(s): TSH, T4TOTAL, FREET4, T3FREE, THYROIDAB in the last 72 hours. Anemia Panel: No results for input(s): VITAMINB12, FOLATE, FERRITIN, TIBC, IRON, RETICCTPCT in the last 72 hours. Urine analysis:    Component Value Date/Time   BILIRUBINUR negative 03/20/2016 1342   KETONESUR negative 03/20/2016 1342   PROTEINUR negative 03/20/2016 1342   UROBILINOGEN 0.2 03/20/2016 1342   NITRITE Negative 03/20/2016 1342    LEUKOCYTESUR Trace (A) 03/20/2016 1342    Radiological Exams on Admission: Dg Chest 2 View  Result Date: 12/21/2018 CLINICAL DATA:  Chest tightness EXAM: CHEST - 2 VIEW COMPARISON:  03/30/2018 FINDINGS: Peribronchial thickening. Stable mild elevation of the left hemidiaphragm. Heart and mediastinal contours are within normal limits. No focal opacities or effusions. No acute bony abnormality. IMPRESSION: Mild bronchitic changes. Electronically Signed   By: Rolm Baptise M.D.   On: 12/21/2018 11:36    EKG: Independently reviewed.  Normal sinus rhythm.  No acute ST-T wave changes.  Assessment/Plan Principal Problem:   Chest pain Active Problems:   GERD (gastroesophageal reflux disease)   Essential hypertension   Ankle swelling     1.  Chest pain: Suspect atypical chest pain. We will admit patient to the telemetry unit given severity of symptoms. Currently chest pain improving Cycle EKG and troponins. Supplemental oxygen to keep saturations more than 90%. Aspirin 81 mg daily. Nitroglycerin and Morphine for severe and recurrent pain. If troponins remain negative, will order Lexiscan in the morning.  2.  Hypertension: Her amlodipine was recently reduced for pedal edema.  Blood pressures are acceptable.  Continue amlodipine and lisinopril.  3.  GERD: On PPI.  Continue.   DVT prophylaxis: Lovenox Code Status: Full code Family Communication: No family at bedside Disposition Plan: Home Consults called: None Admission status: Observation   Barb Merino MD [facility] Pager [phone]  If 7PM-7AM, please contact night-coverage [email or web address] Password [email or web address]  12/21/2018, 1:08 PM

## 2018-12-21 NOTE — ED Notes (Addendum)
Pt escorted to the ER by Urgent Care staff

## 2018-12-22 ENCOUNTER — Observation Stay (HOSPITAL_BASED_OUTPATIENT_CLINIC_OR_DEPARTMENT_OTHER): Payer: BLUE CROSS/BLUE SHIELD

## 2018-12-22 DIAGNOSIS — M25473 Effusion, unspecified ankle: Secondary | ICD-10-CM

## 2018-12-22 DIAGNOSIS — I10 Essential (primary) hypertension: Secondary | ICD-10-CM | POA: Diagnosis not present

## 2018-12-22 DIAGNOSIS — R079 Chest pain, unspecified: Secondary | ICD-10-CM

## 2018-12-22 DIAGNOSIS — K219 Gastro-esophageal reflux disease without esophagitis: Secondary | ICD-10-CM | POA: Diagnosis not present

## 2018-12-22 DIAGNOSIS — R0789 Other chest pain: Secondary | ICD-10-CM | POA: Diagnosis not present

## 2018-12-22 LAB — TROPONIN I: Troponin I: <0.03 ng/mL (ref <0.03)

## 2018-12-22 LAB — HIV ANTIBODY (ROUTINE TESTING W REFLEX): HIV Screen 4th Generation wRfx: NONREACTIVE

## 2018-12-22 MED ORDER — TECHNETIUM TC 99M TETROFOSMIN IV KIT
10.0000 | PACK | Freq: Once | INTRAVENOUS | Status: AC | PRN
  Administered 2018-12-22: 10 via INTRAVENOUS

## 2018-12-22 MED ORDER — REGADENOSON 0.4 MG/5ML IV SOLN
INTRAVENOUS | Status: AC
  Filled 2018-12-22: qty 5

## 2018-12-22 MED ORDER — TECHNETIUM TC 99M TETROFOSMIN IV KIT
30.0000 | PACK | Freq: Once | INTRAVENOUS | Status: AC | PRN
  Administered 2018-12-22: 30 via INTRAVENOUS

## 2018-12-22 MED ORDER — REGADENOSON 0.4 MG/5ML IV SOLN
0.4000 mg | Freq: Once | INTRAVENOUS | Status: AC
  Administered 2018-12-22: 0.4 mg via INTRAVENOUS

## 2018-12-22 MED ORDER — VITAMIN D 25 MCG (1000 UNIT) PO TABS: 1000.0000 [IU] | ORAL_TABLET | Freq: Every day | ORAL | Status: AC

## 2018-12-22 NOTE — Discharge Instructions (Signed)

## 2018-12-22 NOTE — Progress Notes (Signed)
PROGRESS NOTE    [name]  ION:[id number] DOB: [birth year] DOA: 12/21/2018 PCP: Asencion Noble, MD   Brief Narrative:  HPI On 12/21/2018 by Dr. Jeanine Luz Mullinsis a 53 y.o.femalewith medical history significant ofhypertension, chronic constipation who is presenting to the emergency room with 2 days of chest discomfort. According to the patient, she does not have any history of coronary artery disease. She does not have any history of asthma or COPD. Recently has been having trouble with blood pressure control. She was on 10 mg of amlodipine, she developed bilateral leg swelling and her doctor decreased the dose to 5 mg. About 2 days ago she started having vague chest discomfort, mostly on the anterior chest, pressure sensation, intermittent, no exacerbating or relieving factors and occasional shortness of breath but no nausea vomiting or diaphoresis. She was very anxious with this symptoms, her sister recently had sudden death so she got very worried. She called her PCP who was not in town so came to the ER.  Assessment & Plan   Atypical chest pain -Patient describes having a pressure-like sensation in the center of her chest.  It was reproducible with palpation although she denies any recent muscle strain. -Troponin cycled x3 and negative -EKG unremarkable for ACS -Lexiscan shows Large size, moderate severity partially reversible (SDS 5) anteroseptal, septal and inferoseptal perfusion defect, possibly suggestive of ischemia although most likely shifting breast attenuation artifact. LVEF 57% with normal wall motion. This is an intermediate risk study - clinical correlation is advised. -Cardiology consulted and appreciated, given stress test findings   Essential hypertension -Continue amlodipine, lisinopril  Lower extremity edema -Patient complains of having leg swelling, none noted on exam -Explained to patient amlodipine may also cause leg  swelling  GERD -Continue PPI  Depression/anxiety -Patient recently lost her sister approximately 4 weeks ago to unknown causes.  She tells me that she was driving. -Feel that this may have led to patient's "chest pain".  DVT Prophylaxis  lovenox  Code Status: Ful  Family Communication: Husband at bedside  Disposition Plan: Observation. Pending cardiology consult, likely home upon discharge  Consultants Cardiology  Procedures  [place] (From admission, onward)   None      Subjective:   [name] seen and examined today.  Continues to feel chest pressure mid chest.  Denies current nausea vomiting, abdominal pain, shortness of breath, dizziness or headache.  Objective:   Vitals:   12/22/18 1051 12/22/18 1056 12/22/18 1058 12/22/18 1100  BP: 130/75 (!) 146/83 122/70 121/65  Pulse:      Resp:      Temp:      TempSrc:      SpO2:      Weight:      Height:       No intake or output data in the 24 hours ending 12/22/18 1453 Filed Weights   12/21/18 1023 12/22/18 0741  Weight: 77.1 kg 75.5 kg    Exam  General: Well developed, well nourished, NAD, appears stated age  18: NCAT, mucous membranes moist.   Neck: Supple  Cardiovascular: S1 S2 auscultated, no rubs, murmurs or gallops. Regular rate and rhythm.  Respiratory: Clear to auscultation bilaterally with equal chest rise  Abdomen: Soft, nontender, nondistended, + bowel sounds  Extremities: warm dry without cyanosis clubbing or edema  Neuro: AAOx3, nonfocal  Psych: Normal affect and demeanor with intact judgement and insight   Data Reviewed: I have personally reviewed following labs and imaging  studies  CBC: Recent Labs  Lab 12/21/18 1030  WBC 4.4  HGB 13.8  HCT 42.0  MCV 88.4  PLT 474   Basic Metabolic Panel: Recent Labs  Lab 12/21/18 1030  NA 140  K 3.5  CL 108  CO2 24  GLUCOSE 124*  BUN 6  CREATININE 0.68  CALCIUM 9.3   GFR: Estimated  Creatinine Clearance: 83.5 mL/min (by C-G formula based on SCr of 0.68 mg/dL). Liver Function Tests: No results for input(s): AST, ALT, ALKPHOS, BILITOT, PROT, ALBUMIN in the last 168 hours. No results for input(s): LIPASE, AMYLASE in the last 168 hours. No results for input(s): AMMONIA in the last 168 hours. Coagulation Profile: No results for input(s): INR, PROTIME in the last 168 hours. Cardiac Enzymes: Recent Labs  Lab 12/21/18 1030 12/21/18 1342 12/21/18 1846 12/22/18 0256  TROPONINI <0.03 <0.03 <0.03 <0.03   BNP (last 3 results) No results for input(s): PROBNP in the last 8760 hours. HbA1C: No results for input(s): HGBA1C in the last 72 hours. CBG: No results for input(s): GLUCAP in the last 168 hours. Lipid Profile: No results for input(s): CHOL, HDL, LDLCALC, TRIG, CHOLHDL, LDLDIRECT in the last 72 hours. Thyroid Function Tests: No results for input(s): TSH, T4TOTAL, FREET4, T3FREE, THYROIDAB in the last 72 hours. Anemia Panel: No results for input(s): VITAMINB12, FOLATE, FERRITIN, TIBC, IRON, RETICCTPCT in the last 72 hours. Urine analysis:    Component Value Date/Time   BILIRUBINUR negative 03/20/2016 1342   KETONESUR negative 03/20/2016 1342   PROTEINUR negative 03/20/2016 1342   UROBILINOGEN 0.2 03/20/2016 1342   NITRITE Negative 03/20/2016 1342   LEUKOCYTESUR Trace (A) 03/20/2016 1342   Sepsis Labs: @LABRCNTIP (procalcitonin:4,lacticidven:4)  )No results found for this or any previous visit (from the past 240 hour(s)).    Radiology Studies: Dg Chest 2 View  Result Date: 12/21/2018 CLINICAL DATA:  Chest tightness EXAM: CHEST - 2 VIEW COMPARISON:  03/30/2018 FINDINGS: Peribronchial thickening. Stable mild elevation of the left hemidiaphragm. Heart and mediastinal contours are within normal limits. No focal opacities or effusions. No acute bony abnormality. IMPRESSION: Mild bronchitic changes. Electronically Signed   By: Rolm Baptise M.D.   On: 12/21/2018 11:36    Nm Myocar Multi W/spect W/wall Motion / Ef  Result Date: 12/22/2018  Defect 1: There is a large defect of moderate severity.  This is an intermediate risk study.  Nuclear stress EF: 57%.  No T wave inversion was noted during stress.  There was no ST segment deviation noted during stress.  Large size, moderate severity partially reversible (SDS 5) anteroseptal, septal and inferoseptal perfusion defect, possibly suggestive of ischemia although most likely shifting breast attenuation artifact. LVEF 57% with normal wall motion. This is an intermediate risk study - clinical correlation is advised.     Scheduled Meds: . amitriptyline  25 mg Oral QHS  . amLODipine  5 mg Oral QHS  . aspirin EC  81 mg Oral QODAY  . cholecalciferol  1,000 Units Oral Daily  . dorzolamide-timolol  1 drop Left Eye BID  . enoxaparin (LOVENOX) injection  40 mg Subcutaneous Q24H  . irbesartan  150 mg Oral Daily  . latanoprost  1 drop Right Eye QHS  . pantoprazole  40 mg Oral Daily  . regadenoson       Continuous Infusions:   LOS: 0 days   Time Spent in minutes   30 minutes  Elanor Cale D.O. on 12/22/2018 at 2:53 PM  Between 7am to 7pm - Please see pager  noted on [email or web address]  After 7pm go to [email or web address]  And look for the night coverage person covering for me after hours  [facility] Office  [phone]

## 2018-12-22 NOTE — Discharge Summary (Signed)
Physician Discharge Summary  [name]:[id number] DOB: 06-11-1966 DOA: 12/21/2018  PCP: Asencion Noble, MD  Admit date: 12/21/2018 Discharge date: 12/22/2018  Time spent: 45 minutes  Recommendations for Outpatient Follow-up:  Patient will be discharged to home.  Patient will need to follow up with primary care provider within one week of discharge.  Follow up with cardiology, office will contact you. Patient should continue medications as prescribed.  Patient should follow a heart healthy diet.   Discharge Diagnoses:  Principal Problem: Atypical chest pain Essential hypertension Lower extremity edema GERD Depression/anxiety  Discharge Condition: Stable  Diet recommendation: heart healthy  Filed Weights   12/21/18 1023 12/22/18 0741  Weight: 77.1 kg 75.5 kg    History of present illness:  On 12/21/2018 by Dr. Barb Merino [name] is a 53 y.o. female with medical history significant of hypertension, chronic constipation who is presenting to the emergency room with 2 days of chest discomfort.  According to the patient, she does not have any history of coronary artery disease.  She does not have any history of asthma or COPD.  Recently has been having trouble with blood pressure control.  She was on 10 mg of amlodipine, she developed bilateral leg swelling and her doctor decreased the dose to 5 mg.  About 2 days ago she started having vague chest discomfort, mostly on the anterior chest, pressure sensation, intermittent, no exacerbating or relieving factors and occasional shortness of breath but no nausea vomiting or diaphoresis.  She was very anxious with this symptoms, her sister recently had sudden death so she got very worried.  She called her PCP who was not in town so came to the ER.  Hospital Course:  Atypical chest pain -Patient describes having a pressure-like sensation in the center of her chest.  It was reproducible with palpation although she denies any recent  muscle strain. -Troponin cycled x3 and negative -EKG unremarkable for ACS -Lexiscan shows Large size, moderate severity partially reversible (SDS 5) anteroseptal, septal and inferoseptal perfusion defect, possibly suggestive of ischemia although most likely shifting breast attenuation artifact. LVEF 57% with normal wall motion. This is an intermediate risk study - clinical correlation is advised. -Discussed with Dr. Acie Fredrickson, will follow up with patient in the outpatient setting for coronary CT angiogram.  Office will contact patient.  Essential hypertension -Continue amlodipine, lisinopril  Lower extremity edema -Patient complains of having leg swelling, none noted on exam -Explained to patient amlodipine may also cause leg swelling  GERD -Continue PPI  Depression/anxiety -Patient recently lost her sister approximately 4 weeks ago to unknown causes.  She tells me that she was driving. -Feel that this may have led to patient's "chest pain".  Procedures: lexiscan  Consultations: Cardiology via phone  Discharge Exam: Vitals:   12/22/18 1058 12/22/18 1100  BP: 122/70 121/65  Pulse:    Resp:    Temp:    SpO2:       General: Well developed, well nourished, NAD, appears stated age  HEENT: NCAT, mucous membranes moist.  Cardiovascular: S1 S2 auscultated, RRR, no murmur  Respiratory: Clear to auscultation bilaterally with equal chest rise  Abdomen: Soft, nontender, nondistended, + bowel sounds  Extremities: warm dry without cyanosis clubbing or edema  Neuro: AAOx3, nonfocal  Psych: Anxious, however appropriate   Discharge Instructions Discharge Instructions    Discharge instructions   Complete by:  As directed    Patient will be discharged to home.  Patient will need to follow up with  primary care provider within one week of discharge.  Follow up with cardiology, office will contact you. Patient should continue medications as prescribed.  Patient should follow a heart  healthy diet.     Allergies as of 12/22/2018      Reactions   Augmentin [amoxicillin-pot Clavulanate] Diarrhea, Nausea And Vomiting   Codeine Nausea And Vomiting   Adhesive [tape] Rash      Medication List    TAKE these medications   amitriptyline 75 MG tablet Commonly known as:  ELAVIL Take 1 tablet (75 mg total) by mouth at bedtime. What changed:  how much to take   amLODipine 5 MG tablet Commonly known as:  NORVASC Take 5 mg by mouth at bedtime.   aspirin EC 81 MG tablet Take 81 mg by mouth every other day.   cholecalciferol 25 MCG (1000 UT) tablet Commonly known as:  VITAMIN D3 Take 1 tablet (1,000 Units total) by mouth daily. Start taking on:  December 23, 2018 What changed:  how much to take   dorzolamide-timolol 22.3-6.8 MG/ML ophthalmic solution Commonly known as:  COSOPT Place 1 drop into the left eye 2 (two) times daily.   latanoprost 0.005 % ophthalmic solution Commonly known as:  XALATAN Place 1 drop into the right eye at bedtime.   polyethylene glycol powder powder Commonly known as:  GLYCOLAX/MIRALAX Take 17 g by mouth at bedtime.   PRILOSEC PO Take by mouth.   valsartan 160 MG tablet Commonly known as:  DIOVAN Take 160 mg by mouth at bedtime.      Allergies  Allergen Reactions  . Augmentin [Amoxicillin-Pot Clavulanate] Diarrhea and Nausea And Vomiting  . Codeine Nausea And Vomiting  . Adhesive [Tape] Rash   Follow-up Information    Asencion Noble, MD. Schedule an appointment as soon as possible for a visit in 1 week(s).   Specialty:  Internal Medicine Why:  Hospital follow up Contact information: [address] [place] [address] [phone]        Nahser, Wonda Cheng, MD. Schedule an appointment as soon as possible for a visit in 1 week(s).   Specialty:  Cardiology Why:  Office will contact you.  Contact information: [address] [place] [address] [phone]            The results of significant  diagnostics from this hospitalization (including imaging, microbiology, ancillary and laboratory) are listed below for reference.    Significant Diagnostic Studies: Dg Chest 2 View  Result Date: 12/21/2018 CLINICAL DATA:  Chest tightness EXAM: CHEST - 2 VIEW COMPARISON:  03/30/2018 FINDINGS: Peribronchial thickening. Stable mild elevation of the left hemidiaphragm. Heart and mediastinal contours are within normal limits. No focal opacities or effusions. No acute bony abnormality. IMPRESSION: Mild bronchitic changes. Electronically Signed   By: Rolm Baptise M.D.   On: 12/21/2018 11:36   Nm Myocar Multi W/spect W/wall Motion / Ef  Result Date: 12/22/2018  Defect 1: There is a large defect of moderate severity.  This is an intermediate risk study.  Nuclear stress EF: 57%.  No T wave inversion was noted during stress.  There was no ST segment deviation noted during stress.  Large size, moderate severity partially reversible (SDS 5) anteroseptal, septal and inferoseptal perfusion defect, possibly suggestive of ischemia although most likely shifting breast attenuation artifact. LVEF 57% with normal wall motion. This is an intermediate risk study - clinical correlation is advised.    Microbiology: No results found for this or any previous  visit (from the past 240 hour(s)).   Labs: Basic Metabolic Panel: Recent Labs  Lab 12/21/18 1030  NA 140  K 3.5  CL 108  CO2 24  GLUCOSE 124*  BUN 6  CREATININE 0.68  CALCIUM 9.3   Liver Function Tests: No results for input(s): AST, ALT, ALKPHOS, BILITOT, PROT, ALBUMIN in the last 168 hours. No results for input(s): LIPASE, AMYLASE in the last 168 hours. No results for input(s): AMMONIA in the last 168 hours. CBC: Recent Labs  Lab 12/21/18 1030  WBC 4.4  HGB 13.8  HCT 42.0  MCV 88.4  PLT 258   Cardiac Enzymes: Recent Labs  Lab 12/21/18 1030 12/21/18 1342 12/21/18 1846 12/22/18 0256  TROPONINI <0.03 <0.03 <0.03 <0.03   BNP: BNP (last  3 results) No results for input(s): BNP in the last 8760 hours.  ProBNP (last 3 results) No results for input(s): PROBNP in the last 8760 hours.  CBG: No results for input(s): GLUCAP in the last 168 hours.     Signed:  Cristal Ford  [facility] 12/22/2018, 3:23 PM

## 2018-12-27 NOTE — Progress Notes (Signed)
REVIEWED-NO ADDITIONAL RECOMMENDATIONS. 

## 2018-12-31 ENCOUNTER — Encounter: Payer: Self-pay | Admitting: Cardiovascular Disease

## 2018-12-31 NOTE — Progress Notes (Signed)
Cardiology Office Note:    Date:  01/01/2019   ID:  [name], DOB 1966/08/22, MRN [id number]  PCP:  Asencion Noble, MD  Cardiologist:  No primary care provider on file.  Electrophysiologist:  None   Referring MD: Asencion Noble, MD   Chief Complaint  Patient presents with  . Chest Pain    January 01, 2019    [name] is a 53 y.o. female with a hx of chest pain .  She was recently in the hospital with this chest pain.  troponins were negative myoview revealed a large , partially reversible ant. Apical defect Because the CP was rather atypical , Dr. Cristal Ford ask me to review the myoview and see the paitient.   The patient was feeling better and was discharged and set up to see me in the office  CP has been present for several weeks.  Constant May worsen with deep breath.   Does not exercise Not worsened with twisting toro Like  Pressing sensation in her chest . Takes 2 prilosec a day - does not seem to help   Has greatly reduced her activity .  She has notice significant reduction in her exertional capacity.   Has been under stress.    Her sister died suddenly in a car wreck .  The ME says that she likely died before the car wreck  Pain has been present for 2 weeks. Also has had swelling in her ankles   Works for [employer]    Past Medical History:  Diagnosis Date  . Abdominal pain OCT 2011 NL CBC, HFP, LIPASE   MRCP-SEPTATED GB, NL CBD, R LOBE HEMANGIOMA  . Aneurysm ([place]) 2009 COILING   stent placed  . Chronic constipation   . Dysrhythmia    "irregular heartbeat"  . GERD (gastroesophageal reflux disease)   . Glaucoma   . Hemorrhoids, internal, with bleeding DEC 2010 TCS  . Hypertension   . Migraines   . PONV (postoperative nausea and vomiting)   . 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 anuerysm repair     coil  . APPENDECTOMY    . BACTERIAL OVERGROWTH TEST N/A 09/02/2014   POSITIVE  . CHOLECYSTECTOMY  12/06/2011   Procedure: LAPAROSCOPIC CHOLECYSTECTOMY;  Surgeon: Donato Heinz, MD;  Location: [facility];  Service: General;  Laterality: N/A;  . COLONOSCOPY     DEC 2010 IH  . ESOPHAGOGASTRODUODENOSCOPY  09/17/2011   Dr. Oneida Alar :moderate gastritis  . ESOPHAGOGASTRODUODENOSCOPY N/A 11/23/2013   Dr. Theophilus Bones Gastritis in the body and the antrum of the stomach/ SINGLE diverticulum in the second portion of the duodenum, mild chronic gastritis on path. Negative H.pylori. Pursue HBT if persistent issues.   Marland Kitchen EYE SURGERY     glaucoma-unsure if both or just one of them  . GLAUCOMA SURGERY  MAR 2012  . IR RADIOLOGIST EVAL & MGMT  12/29/2017  . TUBAL LIGATION    . UPPER GASTROINTESTINAL ENDOSCOPY     OCT 2011 RING DILATED TO 16MM, CANDIDA ESOPHAGITIS, CHRONIC GASTRITIS    Current Medications: Current Meds  Medication Sig  . amitriptyline (ELAVIL) 25 MG tablet Take 25 mg by mouth at bedtime.  Marland Kitchen amLODipine (NORVASC) 5 MG tablet Take 5 mg by mouth at bedtime.   Marland Kitchen aspirin EC 81 MG tablet Take 81  mg by mouth every other day.  . cholecalciferol (VITAMIN D3) 25 MCG (1000 UT) tablet Take 1 tablet (1,000 Units total) by mouth daily.  . dorzolamide-timolol (COSOPT) 22.3-6.8 MG/ML ophthalmic solution Place 1 drop into the left eye 2 (two) times daily.    Marland Kitchen latanoprost (XALATAN) 0.005 % ophthalmic solution Place 1 drop into the right eye at bedtime.   . Omeprazole (PRILOSEC PO) Take by mouth.  . polyethylene glycol powder (GLYCOLAX/MIRALAX) powder Take 17 g by mouth at bedtime.   . valsartan (DIOVAN) 160 MG tablet Take 160 mg by mouth at bedtime.     Allergies:   Augmentin [amoxicillin-pot clavulanate]; Codeine; and Adhesive [tape]   Social History   Socioeconomic History  . Marital status: Married    Spouse name: Not on file  . Number of children: 1  . Years of education:  Not on file  . Highest education level: Not on file  Occupational History  . Occupation: Health visitor: [employer]  Social Needs  . Financial resource strain: Not on file  . Food insecurity:    Worry: Not on file    Inability: Not on file  . Transportation needs:    Medical: Not on file    Non-medical: Not on file  Tobacco Use  . Smoking status: Never Smoker  . Smokeless tobacco: Never Used  Substance and Sexual Activity  . Alcohol use: No  . Drug use: No  . Sexual activity: Yes    Partners: Male    Birth control/protection: Surgical    Comment: spouse  Lifestyle  . Physical activity:    Days per week: Not on file    Minutes per session: Not on file  . Stress: Not on file  Relationships  . Social connections:    Talks on phone: Not on file    Gets together: Not on file    Attends religious service: Not on file    Active member of club or organization: Not on file    Attends meetings of clubs or organizations: Not on file    Relationship status: Not on file  Other Topics Concern  . Not on file  Social History Narrative   Married.  Works at [employer].  One daughter born 43     Family History: The patient's family history includes Breast cancer in her cousin; Diabetes in her maternal grandmother; Heart disease in her mother and paternal grandfather; Stroke in her paternal grandmother. There is no history of Anesthesia problems, Hypotension, Malignant hyperthermia, or Pseudochol deficiency.  ROS:   Please see the history of present illness.     All other systems reviewed and are negative.  EKGs/Labs/Other Studies Reviewed:    The following studies were reviewed today:   EKG:   12/22/18  - NSR no ST or T wave changes.   Recent Labs: 12/21/2018: BUN 6; Creatinine, Ser 0.68; Hemoglobin 13.8; Platelets 258; Potassium 3.5; Sodium 140  Recent Lipid Panel No results found for: CHOL, TRIG, HDL, CHOLHDL, VLDL, LDLCALC, LDLDIRECT  Physical Exam:    VS:  BP 118/82   Pulse 62    Ht 5\' 6"  (1.676 m)   Wt 174 lb 9.6 oz (79.2 kg)   LMP 08/09/2015 (Within Days) Comment: pt. having irregular period, perimenopausal  SpO2 99%   BMI 28.18 kg/m     Wt Readings from Last 3 Encounters:  01/01/19 174 lb 9.6 oz (79.2 kg)  12/22/18 166 lb 6.4 oz (75.5 kg)  03/14/18 169 lb 3.2  oz (76.7 kg)     GEN:   Middle age female,  NAD  HEENT: Normal NECK: No JVD; No carotid bruits LYMPHATICS: No lymphadenopathy CARDIAC: RRR, no murmurs, rubs, gallops,  Rib tenderness along her left 3rd -4th rib  RESPIRATORY:  Clear to auscultation without rales, wheezing or rhonchi  ABDOMEN: Soft, non-tender, non-distended MUSCULOSKELETAL:  No edema; No deformity  SKIN: Warm and dry NEUROLOGIC:  Alert and oriented x 3 PSYCHIATRIC:  Normal affect   ASSESSMENT:    No diagnosis found. PLAN:    In order of problems listed above:  1.  chest pain:    Crestina has had chest pain for the past 2 weeks.  The pain is been fairly constant.  It is not worsened with exercise, deep breaths,.  It may worsen with twisting or turning her torso.  She has lots of rib tenderness along her left third and fourth rib.  troponins were negative in the hospital last week despite having chest pain for the previous week.  2. Abnormal myoview:  She did have an anterior septal  reversible defect on her Myoview scan.  Its possible that this ant. Septal defect is due to a breast  artifact .   She will call if her CP worsens.    I would like to schedule her for a coronary CT angiogram .   Will have her return to see an APP in 2 months .   Medication Adjustments/Labs and Tests Ordered: Current medicines are reviewed at length with the patient today.  Concerns regarding medicines are outlined above.  No orders of the defined types were placed in this encounter.  Meds ordered this encounter  Medications  . metoprolol tartrate (LOPRESSOR) 100 MG tablet    Sig: Take 1 pill 2 hours before your CT    Dispense:  1 tablet     Refill:  0    Patient Instructions  Medication Instructions:  Your physician has recommended you make the following change in your medication:  START Motrin (Ibuprofen/Advil) 400 mg 3 times per day (every 8 hours) for the next 7 days then gradually decrease for a few days then stop If you need a refill on your cardiac medications before your next appointment, please call your pharmacy.   Lab work: None Ordered   Testing/Procedures: Please arrive at the [facility] main entrance of [facility] at xx:xx AM (30-45 minutes prior to test start time)  [facility] [place], [place] [address] [phone]  Proceed to the [facility] Radiology Department (First Floor).  Please follow these instructions carefully (unless otherwise directed):   On the Night Before the Test: . Be sure to Drink plenty of water. . Do not consume any caffeinated/decaffeinated beverages or chocolate 12 hours prior to your test. . Do not take any antihistamines 12 hours prior to your test.  On the Day of the Test: . Drink plenty of water. Do not drink any water within one hour of the test. . Do not eat any food 4 hours prior to the test. . You may take your regular medications prior to the test.  . Take metoprolol (Lopressor) two hours prior to test.       After the Test: . Drink plenty of water. . After receiving IV contrast, you may experience a mild flushed feeling. This is normal. . On occasion, you may experience a mild rash up to 24 hours after the test. This is not dangerous. If  this occurs, you can take Benadryl 25 mg and increase your fluid intake. . If you experience trouble breathing, this can be serious. If it is severe call 911 IMMEDIATELY. If it is mild, please call our office. . If you take any of these medications: Glipizide/Metformin, Avandament, Glucavance, please do not take 48 hours after completing test.   Follow-Up: At [facility], you and  your health needs are our priority.  As part of our continuing mission to provide you with exceptional heart care, we have created designated Provider Care Teams.  These Care Teams include your primary Cardiologist (physician) and Advanced Practice Providers (APPs -  Physician Assistants and Nurse Practitioners) who all work together to provide you with the care you need, when you need it. You will need a follow up appointment in:  2 months.  You may see Dr. Acie Fredrickson or one of the following Advanced Practice Providers on your designated Care Team: Richardson Dopp, PA-C [place], [place] . Daune Perch, NP      Signed, Mertie Moores, MD  01/01/2019 2:32 PM    [facility]

## 2019-01-01 ENCOUNTER — Other Ambulatory Visit: Payer: Self-pay

## 2019-01-01 ENCOUNTER — Encounter: Payer: Self-pay | Admitting: Cardiovascular Disease

## 2019-01-01 ENCOUNTER — Ambulatory Visit: Payer: BLUE CROSS/BLUE SHIELD | Admitting: Cardiovascular Disease

## 2019-01-01 VITALS — BP 118/82 | HR 62 | Ht 66.0 in | Wt 174.6 lb

## 2019-01-01 DIAGNOSIS — R9439 Abnormal result of other cardiovascular function study: Secondary | ICD-10-CM

## 2019-01-01 DIAGNOSIS — R0789 Other chest pain: Secondary | ICD-10-CM | POA: Diagnosis not present

## 2019-01-01 MED ORDER — METOPROLOL TARTRATE 100 MG PO TABS: ORAL_TABLET | ORAL | 0 refills | Status: DC

## 2019-01-01 NOTE — Patient Instructions (Addendum)
Medication Instructions:  Your physician has recommended you make the following change in your medication:  START Motrin (Ibuprofen/Advil) 400 mg 3 times per day (every 8 hours) for the next 7 days then gradually decrease for a few days then stop If you need a refill on your cardiac medications before your next appointment, please call your pharmacy.   Lab work: None Ordered   Testing/Procedures: Please arrive at the [facility] main entrance of [facility] at xx:xx AM (30-45 minutes prior to test start time)  [facility] [place], [place] [address] [phone]  Proceed to the [facility] Radiology Department (First Floor).  Please follow these instructions carefully (unless otherwise directed):   On the Night Before the Test: . Be sure to Drink plenty of water. . Do not consume any caffeinated/decaffeinated beverages or chocolate 12 hours prior to your test. . Do not take any antihistamines 12 hours prior to your test.  On the Day of the Test: . Drink plenty of water. Do not drink any water within one hour of the test. . Do not eat any food 4 hours prior to the test. . You may take your regular medications prior to the test.  . Take metoprolol (Lopressor) two hours prior to test.       After the Test: . Drink plenty of water. . After receiving IV contrast, you may experience a mild flushed feeling. This is normal. . On occasion, you may experience a mild rash up to 24 hours after the test. This is not dangerous. If this occurs, you can take Benadryl 25 mg and increase your fluid intake. . If you experience trouble breathing, this can be serious. If it is severe call 911 IMMEDIATELY. If it is mild, please call our office. . If you take any of these medications: Glipizide/Metformin, Avandament, Glucavance, please do not take 48 hours after completing test.   Follow-Up: At [facility], you and your health needs are our priority.  As part  of our continuing mission to provide you with exceptional heart care, we have created designated Provider Care Teams.  These Care Teams include your primary Cardiologist (physician) and Advanced Practice Providers (APPs -  Physician Assistants and Nurse Practitioners) who all work together to provide you with the care you need, when you need it. You will need a follow up appointment in:  2 months.  You may see Dr. Acie Fredrickson or one of the following Advanced Practice Providers on your designated Care Team: Richardson Dopp, PA-C [place], [place] . Daune Perch, NP

## 2019-02-08 DIAGNOSIS — I10 Essential (primary) hypertension: Secondary | ICD-10-CM | POA: Diagnosis not present

## 2019-02-27 ENCOUNTER — Telehealth: Payer: BLUE CROSS/BLUE SHIELD | Admitting: Physician Assistant

## 2019-03-13 DIAGNOSIS — I10 Essential (primary) hypertension: Secondary | ICD-10-CM | POA: Diagnosis not present

## 2019-04-12 ENCOUNTER — Encounter: Payer: Self-pay | Admitting: Cardiovascular Disease

## 2019-05-16 DIAGNOSIS — E876 Hypokalemia: Secondary | ICD-10-CM | POA: Diagnosis not present

## 2019-05-16 DIAGNOSIS — Z79899 Other long term (current) drug therapy: Secondary | ICD-10-CM | POA: Diagnosis not present

## 2019-05-16 DIAGNOSIS — I10 Essential (primary) hypertension: Secondary | ICD-10-CM | POA: Diagnosis not present

## 2019-05-16 DIAGNOSIS — R7301 Impaired fasting glucose: Secondary | ICD-10-CM | POA: Diagnosis not present

## 2019-05-22 DIAGNOSIS — R609 Edema, unspecified: Secondary | ICD-10-CM | POA: Diagnosis not present

## 2019-05-22 DIAGNOSIS — I10 Essential (primary) hypertension: Secondary | ICD-10-CM | POA: Diagnosis not present

## 2019-07-05 DIAGNOSIS — N760 Acute vaginitis: Secondary | ICD-10-CM | POA: Diagnosis not present

## 2019-07-23 DIAGNOSIS — I10 Essential (primary) hypertension: Secondary | ICD-10-CM | POA: Diagnosis not present

## 2019-07-23 DIAGNOSIS — R609 Edema, unspecified: Secondary | ICD-10-CM | POA: Diagnosis not present

## 2019-09-03 DIAGNOSIS — H402222 Chronic angle-closure glaucoma, left eye, moderate stage: Secondary | ICD-10-CM | POA: Diagnosis not present

## 2019-09-03 DIAGNOSIS — H02883 Meibomian gland dysfunction of right eye, unspecified eyelid: Secondary | ICD-10-CM | POA: Diagnosis not present

## 2019-09-03 DIAGNOSIS — H40001 Preglaucoma, unspecified, right eye: Secondary | ICD-10-CM | POA: Diagnosis not present

## 2019-09-03 DIAGNOSIS — H2511 Age-related nuclear cataract, right eye: Secondary | ICD-10-CM | POA: Diagnosis not present

## 2019-11-02 ENCOUNTER — Other Ambulatory Visit: Payer: Self-pay | Admitting: Obstetrics and Gynecology

## 2019-11-02 DIAGNOSIS — Z1231 Encounter for screening mammogram for malignant neoplasm of breast: Secondary | ICD-10-CM

## 2019-11-28 DIAGNOSIS — G43909 Migraine, unspecified, not intractable, without status migrainosus: Secondary | ICD-10-CM | POA: Diagnosis not present

## 2019-11-28 DIAGNOSIS — I10 Essential (primary) hypertension: Secondary | ICD-10-CM | POA: Diagnosis not present

## 2019-12-11 ENCOUNTER — Ambulatory Visit
Admission: RE | Admit: 2019-12-11 | Discharge: 2019-12-11 | Disposition: A | Discharge status: Home / Self Care | Payer: BC Managed Care – PPO | Source: Ambulatory Visit | Attending: Obstetrics and Gynecology | Admitting: Obstetrics and Gynecology

## 2019-12-11 ENCOUNTER — Other Ambulatory Visit: Payer: Self-pay

## 2019-12-11 DIAGNOSIS — Z1231 Encounter for screening mammogram for malignant neoplasm of breast: Secondary | ICD-10-CM | POA: Diagnosis not present

## 2020-01-15 DIAGNOSIS — Z6827 Body mass index (BMI) 27.0-27.9, adult: Secondary | ICD-10-CM | POA: Diagnosis not present

## 2020-01-15 DIAGNOSIS — R87619 Unspecified abnormal cytological findings in specimens from cervix uteri: Secondary | ICD-10-CM | POA: Insufficient documentation

## 2020-01-15 DIAGNOSIS — L292 Pruritus vulvae: Secondary | ICD-10-CM | POA: Diagnosis not present

## 2020-01-15 DIAGNOSIS — Z01419 Encounter for gynecological examination (general) (routine) without abnormal findings: Secondary | ICD-10-CM | POA: Diagnosis not present

## 2020-02-12 DIAGNOSIS — N9089 Other specified noninflammatory disorders of vulva and perineum: Secondary | ICD-10-CM | POA: Diagnosis not present

## 2020-02-12 DIAGNOSIS — N905 Atrophy of vulva: Secondary | ICD-10-CM | POA: Diagnosis not present

## 2020-02-12 DIAGNOSIS — L292 Pruritus vulvae: Secondary | ICD-10-CM | POA: Diagnosis not present

## 2020-04-02 DIAGNOSIS — Z6827 Body mass index (BMI) 27.0-27.9, adult: Secondary | ICD-10-CM | POA: Diagnosis not present

## 2020-04-02 DIAGNOSIS — G43909 Migraine, unspecified, not intractable, without status migrainosus: Secondary | ICD-10-CM | POA: Diagnosis not present

## 2020-04-02 DIAGNOSIS — I10 Essential (primary) hypertension: Secondary | ICD-10-CM | POA: Diagnosis not present

## 2020-04-17 DIAGNOSIS — H402222 Chronic angle-closure glaucoma, left eye, moderate stage: Secondary | ICD-10-CM | POA: Diagnosis not present

## 2020-04-17 DIAGNOSIS — H40001 Preglaucoma, unspecified, right eye: Secondary | ICD-10-CM | POA: Diagnosis not present

## 2020-04-17 DIAGNOSIS — Q112 Microphthalmos: Secondary | ICD-10-CM | POA: Diagnosis not present

## 2020-04-17 DIAGNOSIS — H2511 Age-related nuclear cataract, right eye: Secondary | ICD-10-CM | POA: Diagnosis not present

## 2020-05-12 DIAGNOSIS — Z1211 Encounter for screening for malignant neoplasm of colon: Secondary | ICD-10-CM | POA: Diagnosis not present

## 2020-07-03 DIAGNOSIS — Z20822 Contact with and (suspected) exposure to covid-19: Secondary | ICD-10-CM | POA: Diagnosis not present

## 2020-07-30 DIAGNOSIS — I10 Essential (primary) hypertension: Secondary | ICD-10-CM | POA: Diagnosis not present

## 2020-07-30 DIAGNOSIS — G43909 Migraine, unspecified, not intractable, without status migrainosus: Secondary | ICD-10-CM | POA: Diagnosis not present

## 2020-11-27 DIAGNOSIS — I10 Essential (primary) hypertension: Secondary | ICD-10-CM | POA: Diagnosis not present

## 2020-11-27 DIAGNOSIS — G43909 Migraine, unspecified, not intractable, without status migrainosus: Secondary | ICD-10-CM | POA: Diagnosis not present

## 2020-11-27 DIAGNOSIS — Z79899 Other long term (current) drug therapy: Secondary | ICD-10-CM | POA: Diagnosis not present

## 2020-12-02 DIAGNOSIS — I10 Essential (primary) hypertension: Secondary | ICD-10-CM | POA: Diagnosis not present

## 2020-12-02 DIAGNOSIS — E876 Hypokalemia: Secondary | ICD-10-CM | POA: Diagnosis not present

## 2020-12-09 ENCOUNTER — Other Ambulatory Visit: Payer: Self-pay | Admitting: Obstetrics and Gynecology

## 2020-12-09 DIAGNOSIS — Z1231 Encounter for screening mammogram for malignant neoplasm of breast: Secondary | ICD-10-CM

## 2020-12-16 DIAGNOSIS — R7309 Other abnormal glucose: Secondary | ICD-10-CM | POA: Diagnosis not present

## 2020-12-22 DIAGNOSIS — Q111 Other anophthalmos: Secondary | ICD-10-CM | POA: Diagnosis not present

## 2020-12-22 DIAGNOSIS — H402222 Chronic angle-closure glaucoma, left eye, moderate stage: Secondary | ICD-10-CM | POA: Diagnosis not present

## 2020-12-22 DIAGNOSIS — H40001 Preglaucoma, unspecified, right eye: Secondary | ICD-10-CM | POA: Diagnosis not present

## 2020-12-22 DIAGNOSIS — H57812 Brow ptosis, left: Secondary | ICD-10-CM | POA: Diagnosis not present

## 2021-01-07 DIAGNOSIS — B009 Herpesviral infection, unspecified: Secondary | ICD-10-CM | POA: Diagnosis not present

## 2021-01-07 DIAGNOSIS — I10 Essential (primary) hypertension: Secondary | ICD-10-CM | POA: Diagnosis not present

## 2021-01-12 ENCOUNTER — Other Ambulatory Visit: Payer: Self-pay

## 2021-01-12 ENCOUNTER — Ambulatory Visit
Admission: RE | Admit: 2021-01-12 | Discharge: 2021-01-12 | Disposition: A | Discharge status: Home / Self Care | Payer: BC Managed Care – PPO | Source: Ambulatory Visit

## 2021-01-12 DIAGNOSIS — Z1231 Encounter for screening mammogram for malignant neoplasm of breast: Secondary | ICD-10-CM | POA: Diagnosis not present

## 2021-01-16 DIAGNOSIS — R7309 Other abnormal glucose: Secondary | ICD-10-CM | POA: Diagnosis not present

## 2021-02-02 DIAGNOSIS — E876 Hypokalemia: Secondary | ICD-10-CM | POA: Diagnosis not present

## 2021-02-02 DIAGNOSIS — Z79899 Other long term (current) drug therapy: Secondary | ICD-10-CM | POA: Diagnosis not present

## 2021-02-02 DIAGNOSIS — G43909 Migraine, unspecified, not intractable, without status migrainosus: Secondary | ICD-10-CM | POA: Diagnosis not present

## 2021-02-02 DIAGNOSIS — I10 Essential (primary) hypertension: Secondary | ICD-10-CM | POA: Diagnosis not present

## 2021-02-09 DIAGNOSIS — R001 Bradycardia, unspecified: Secondary | ICD-10-CM | POA: Diagnosis not present

## 2021-02-09 DIAGNOSIS — Z6827 Body mass index (BMI) 27.0-27.9, adult: Secondary | ICD-10-CM | POA: Diagnosis not present

## 2021-02-09 DIAGNOSIS — R7301 Impaired fasting glucose: Secondary | ICD-10-CM | POA: Diagnosis not present

## 2021-02-09 DIAGNOSIS — I10 Essential (primary) hypertension: Secondary | ICD-10-CM | POA: Diagnosis not present

## 2021-02-09 DIAGNOSIS — E876 Hypokalemia: Secondary | ICD-10-CM | POA: Diagnosis not present

## 2021-02-11 DIAGNOSIS — Z6826 Body mass index (BMI) 26.0-26.9, adult: Secondary | ICD-10-CM | POA: Diagnosis not present

## 2021-02-11 DIAGNOSIS — Z13820 Encounter for screening for osteoporosis: Secondary | ICD-10-CM | POA: Diagnosis not present

## 2021-02-11 DIAGNOSIS — Z01419 Encounter for gynecological examination (general) (routine) without abnormal findings: Secondary | ICD-10-CM | POA: Diagnosis not present

## 2021-02-15 DIAGNOSIS — N390 Urinary tract infection, site not specified: Secondary | ICD-10-CM | POA: Diagnosis not present

## 2021-02-15 DIAGNOSIS — R7309 Other abnormal glucose: Secondary | ICD-10-CM | POA: Diagnosis not present

## 2021-03-18 DIAGNOSIS — R7309 Other abnormal glucose: Secondary | ICD-10-CM | POA: Diagnosis not present

## 2021-04-17 DIAGNOSIS — R7309 Other abnormal glucose: Secondary | ICD-10-CM | POA: Diagnosis not present

## 2021-05-18 DIAGNOSIS — R7309 Other abnormal glucose: Secondary | ICD-10-CM | POA: Diagnosis not present

## 2021-06-03 DIAGNOSIS — E876 Hypokalemia: Secondary | ICD-10-CM | POA: Diagnosis not present

## 2021-06-10 DIAGNOSIS — E876 Hypokalemia: Secondary | ICD-10-CM | POA: Diagnosis not present

## 2021-06-10 DIAGNOSIS — I10 Essential (primary) hypertension: Secondary | ICD-10-CM | POA: Diagnosis not present

## 2021-06-18 DIAGNOSIS — R7309 Other abnormal glucose: Secondary | ICD-10-CM | POA: Diagnosis not present

## 2021-07-18 DIAGNOSIS — Z7189 Other specified counseling: Secondary | ICD-10-CM | POA: Diagnosis not present

## 2021-07-18 DIAGNOSIS — R7309 Other abnormal glucose: Secondary | ICD-10-CM | POA: Diagnosis not present

## 2021-08-03 DIAGNOSIS — H57812 Brow ptosis, left: Secondary | ICD-10-CM | POA: Diagnosis not present

## 2021-08-03 DIAGNOSIS — H402222 Chronic angle-closure glaucoma, left eye, moderate stage: Secondary | ICD-10-CM | POA: Diagnosis not present

## 2021-08-03 DIAGNOSIS — H402211 Chronic angle-closure glaucoma, right eye, mild stage: Secondary | ICD-10-CM | POA: Diagnosis not present

## 2021-08-03 DIAGNOSIS — H2511 Age-related nuclear cataract, right eye: Secondary | ICD-10-CM | POA: Diagnosis not present

## 2021-08-18 DIAGNOSIS — Z7189 Other specified counseling: Secondary | ICD-10-CM | POA: Diagnosis not present

## 2021-08-18 DIAGNOSIS — R7309 Other abnormal glucose: Secondary | ICD-10-CM | POA: Diagnosis not present

## 2021-09-17 DIAGNOSIS — Z7189 Other specified counseling: Secondary | ICD-10-CM | POA: Diagnosis not present

## 2021-09-17 DIAGNOSIS — R7309 Other abnormal glucose: Secondary | ICD-10-CM | POA: Diagnosis not present

## 2021-10-15 DIAGNOSIS — I10 Essential (primary) hypertension: Secondary | ICD-10-CM | POA: Diagnosis not present

## 2021-10-18 DIAGNOSIS — Z7189 Other specified counseling: Secondary | ICD-10-CM | POA: Diagnosis not present

## 2021-10-18 DIAGNOSIS — R7309 Other abnormal glucose: Secondary | ICD-10-CM | POA: Diagnosis not present

## 2021-11-18 DIAGNOSIS — Z7189 Other specified counseling: Secondary | ICD-10-CM | POA: Diagnosis not present

## 2021-11-18 DIAGNOSIS — R7309 Other abnormal glucose: Secondary | ICD-10-CM | POA: Diagnosis not present

## 2021-12-16 DIAGNOSIS — R7309 Other abnormal glucose: Secondary | ICD-10-CM | POA: Diagnosis not present

## 2021-12-16 DIAGNOSIS — Z7189 Other specified counseling: Secondary | ICD-10-CM | POA: Diagnosis not present

## 2022-01-15 DIAGNOSIS — H43812 Vitreous degeneration, left eye: Secondary | ICD-10-CM | POA: Diagnosis not present

## 2022-01-16 DIAGNOSIS — R7309 Other abnormal glucose: Secondary | ICD-10-CM | POA: Diagnosis not present

## 2022-01-16 DIAGNOSIS — Z7189 Other specified counseling: Secondary | ICD-10-CM | POA: Diagnosis not present

## 2022-02-01 DIAGNOSIS — H402222 Chronic angle-closure glaucoma, left eye, moderate stage: Secondary | ICD-10-CM | POA: Diagnosis not present

## 2022-02-01 DIAGNOSIS — H02881 Meibomian gland dysfunction right upper eyelid: Secondary | ICD-10-CM | POA: Diagnosis not present

## 2022-02-01 DIAGNOSIS — H57812 Brow ptosis, left: Secondary | ICD-10-CM | POA: Diagnosis not present

## 2022-02-01 DIAGNOSIS — H402211 Chronic angle-closure glaucoma, right eye, mild stage: Secondary | ICD-10-CM | POA: Diagnosis not present

## 2022-02-04 DIAGNOSIS — Z79899 Other long term (current) drug therapy: Secondary | ICD-10-CM | POA: Diagnosis not present

## 2022-02-04 DIAGNOSIS — E876 Hypokalemia: Secondary | ICD-10-CM | POA: Diagnosis not present

## 2022-02-04 DIAGNOSIS — K219 Gastro-esophageal reflux disease without esophagitis: Secondary | ICD-10-CM | POA: Diagnosis not present

## 2022-02-04 DIAGNOSIS — R7303 Prediabetes: Secondary | ICD-10-CM | POA: Diagnosis not present

## 2022-02-04 DIAGNOSIS — I10 Essential (primary) hypertension: Secondary | ICD-10-CM | POA: Diagnosis not present

## 2022-02-11 DIAGNOSIS — Z6825 Body mass index (BMI) 25.0-25.9, adult: Secondary | ICD-10-CM | POA: Diagnosis not present

## 2022-02-11 DIAGNOSIS — R7309 Other abnormal glucose: Secondary | ICD-10-CM | POA: Diagnosis not present

## 2022-02-11 DIAGNOSIS — R001 Bradycardia, unspecified: Secondary | ICD-10-CM | POA: Diagnosis not present

## 2022-02-11 DIAGNOSIS — G43909 Migraine, unspecified, not intractable, without status migrainosus: Secondary | ICD-10-CM | POA: Diagnosis not present

## 2022-02-11 DIAGNOSIS — I10 Essential (primary) hypertension: Secondary | ICD-10-CM | POA: Diagnosis not present

## 2022-02-15 DIAGNOSIS — R7309 Other abnormal glucose: Secondary | ICD-10-CM | POA: Diagnosis not present

## 2022-02-15 DIAGNOSIS — Z7189 Other specified counseling: Secondary | ICD-10-CM | POA: Diagnosis not present

## 2022-02-26 DIAGNOSIS — Q112 Microphthalmos: Secondary | ICD-10-CM | POA: Diagnosis not present

## 2022-02-26 DIAGNOSIS — H402211 Chronic angle-closure glaucoma, right eye, mild stage: Secondary | ICD-10-CM | POA: Diagnosis not present

## 2022-02-26 DIAGNOSIS — H43812 Vitreous degeneration, left eye: Secondary | ICD-10-CM | POA: Diagnosis not present

## 2022-03-08 DIAGNOSIS — H6692 Otitis media, unspecified, left ear: Secondary | ICD-10-CM | POA: Diagnosis not present

## 2022-03-18 DIAGNOSIS — R7309 Other abnormal glucose: Secondary | ICD-10-CM | POA: Diagnosis not present

## 2022-03-18 DIAGNOSIS — Z7189 Other specified counseling: Secondary | ICD-10-CM | POA: Diagnosis not present

## 2022-04-17 DIAGNOSIS — Z7189 Other specified counseling: Secondary | ICD-10-CM | POA: Diagnosis not present

## 2022-04-17 DIAGNOSIS — R7309 Other abnormal glucose: Secondary | ICD-10-CM | POA: Diagnosis not present

## 2022-04-27 ENCOUNTER — Other Ambulatory Visit: Payer: Self-pay | Admitting: Obstetrics and Gynecology

## 2022-04-27 DIAGNOSIS — Z01419 Encounter for gynecological examination (general) (routine) without abnormal findings: Secondary | ICD-10-CM | POA: Diagnosis not present

## 2022-04-27 DIAGNOSIS — Z1231 Encounter for screening mammogram for malignant neoplasm of breast: Secondary | ICD-10-CM

## 2022-04-27 DIAGNOSIS — Z1151 Encounter for screening for human papillomavirus (HPV): Secondary | ICD-10-CM | POA: Diagnosis not present

## 2022-04-27 DIAGNOSIS — Z124 Encounter for screening for malignant neoplasm of cervix: Secondary | ICD-10-CM | POA: Diagnosis not present

## 2022-04-27 DIAGNOSIS — Z6826 Body mass index (BMI) 26.0-26.9, adult: Secondary | ICD-10-CM | POA: Diagnosis not present

## 2022-05-04 DIAGNOSIS — Q112 Microphthalmos: Secondary | ICD-10-CM | POA: Diagnosis not present

## 2022-05-04 DIAGNOSIS — H43812 Vitreous degeneration, left eye: Secondary | ICD-10-CM | POA: Diagnosis not present

## 2022-05-04 DIAGNOSIS — H402222 Chronic angle-closure glaucoma, left eye, moderate stage: Secondary | ICD-10-CM | POA: Diagnosis not present

## 2022-05-04 DIAGNOSIS — H402211 Chronic angle-closure glaucoma, right eye, mild stage: Secondary | ICD-10-CM | POA: Diagnosis not present

## 2022-05-18 DIAGNOSIS — R7309 Other abnormal glucose: Secondary | ICD-10-CM | POA: Diagnosis not present

## 2022-05-19 ENCOUNTER — Ambulatory Visit
Admission: RE | Admit: 2022-05-19 | Discharge: 2022-05-19 | Disposition: A | Discharge status: Home / Self Care | Payer: BC Managed Care – PPO | Source: Ambulatory Visit | Attending: Obstetrics and Gynecology | Admitting: Obstetrics and Gynecology

## 2022-05-19 DIAGNOSIS — Z1231 Encounter for screening mammogram for malignant neoplasm of breast: Secondary | ICD-10-CM

## 2022-05-21 ENCOUNTER — Other Ambulatory Visit: Payer: Self-pay | Admitting: Obstetrics and Gynecology

## 2022-05-21 DIAGNOSIS — R928 Other abnormal and inconclusive findings on diagnostic imaging of breast: Secondary | ICD-10-CM

## 2022-05-27 ENCOUNTER — Ambulatory Visit
Admission: RE | Admit: 2022-05-27 | Discharge: 2022-05-27 | Disposition: A | Discharge status: Home / Self Care | Payer: BC Managed Care – PPO | Source: Ambulatory Visit | Attending: Obstetrics and Gynecology | Admitting: Obstetrics and Gynecology

## 2022-05-27 ENCOUNTER — Ambulatory Visit: Payer: BC Managed Care – PPO

## 2022-05-27 DIAGNOSIS — R922 Inconclusive mammogram: Secondary | ICD-10-CM | POA: Diagnosis not present

## 2022-05-27 DIAGNOSIS — R928 Other abnormal and inconclusive findings on diagnostic imaging of breast: Secondary | ICD-10-CM

## 2022-06-18 DIAGNOSIS — R7309 Other abnormal glucose: Secondary | ICD-10-CM | POA: Diagnosis not present

## 2022-07-18 DIAGNOSIS — R7309 Other abnormal glucose: Secondary | ICD-10-CM | POA: Diagnosis not present

## 2022-08-05 DIAGNOSIS — H40002 Preglaucoma, unspecified, left eye: Secondary | ICD-10-CM | POA: Diagnosis not present

## 2022-08-05 DIAGNOSIS — H43812 Vitreous degeneration, left eye: Secondary | ICD-10-CM | POA: Diagnosis not present

## 2022-08-05 DIAGNOSIS — H57812 Brow ptosis, left: Secondary | ICD-10-CM | POA: Diagnosis not present

## 2022-08-05 DIAGNOSIS — H402222 Chronic angle-closure glaucoma, left eye, moderate stage: Secondary | ICD-10-CM | POA: Diagnosis not present

## 2022-08-13 DIAGNOSIS — Z23 Encounter for immunization: Secondary | ICD-10-CM | POA: Diagnosis not present

## 2022-08-13 DIAGNOSIS — I10 Essential (primary) hypertension: Secondary | ICD-10-CM | POA: Diagnosis not present

## 2022-08-13 DIAGNOSIS — G43909 Migraine, unspecified, not intractable, without status migrainosus: Secondary | ICD-10-CM | POA: Diagnosis not present

## 2022-08-18 DIAGNOSIS — R7309 Other abnormal glucose: Secondary | ICD-10-CM | POA: Diagnosis not present

## 2022-09-17 DIAGNOSIS — R7309 Other abnormal glucose: Secondary | ICD-10-CM | POA: Diagnosis not present

## 2022-10-18 DIAGNOSIS — R7309 Other abnormal glucose: Secondary | ICD-10-CM | POA: Diagnosis not present

## 2022-11-18 DIAGNOSIS — R7309 Other abnormal glucose: Secondary | ICD-10-CM | POA: Diagnosis not present

## 2022-12-01 DIAGNOSIS — J0100 Acute maxillary sinusitis, unspecified: Secondary | ICD-10-CM | POA: Diagnosis not present

## 2022-12-01 DIAGNOSIS — R051 Acute cough: Secondary | ICD-10-CM | POA: Diagnosis not present

## 2022-12-01 DIAGNOSIS — J029 Acute pharyngitis, unspecified: Secondary | ICD-10-CM | POA: Diagnosis not present

## 2022-12-14 IMAGING — MG MM DIGITAL SCREENING BILAT W/ TOMO AND CAD
8 series · 9 of 24 positions shown · non-contrast
Comparison: Previous exam(s).

CLINICAL DATA: Screening.

EXAM:
DIGITAL SCREENING BILATERAL MAMMOGRAM WITH TOMOSYNTHESIS AND CAD
TECHNIQUE: Bilateral screening digital craniocaudal and mediolateral oblique
mammograms were obtained. Bilateral screening digital breast
tomosynthesis was performed. The images were evaluated with
computer-aided detection.

[R MLO synth-2D]
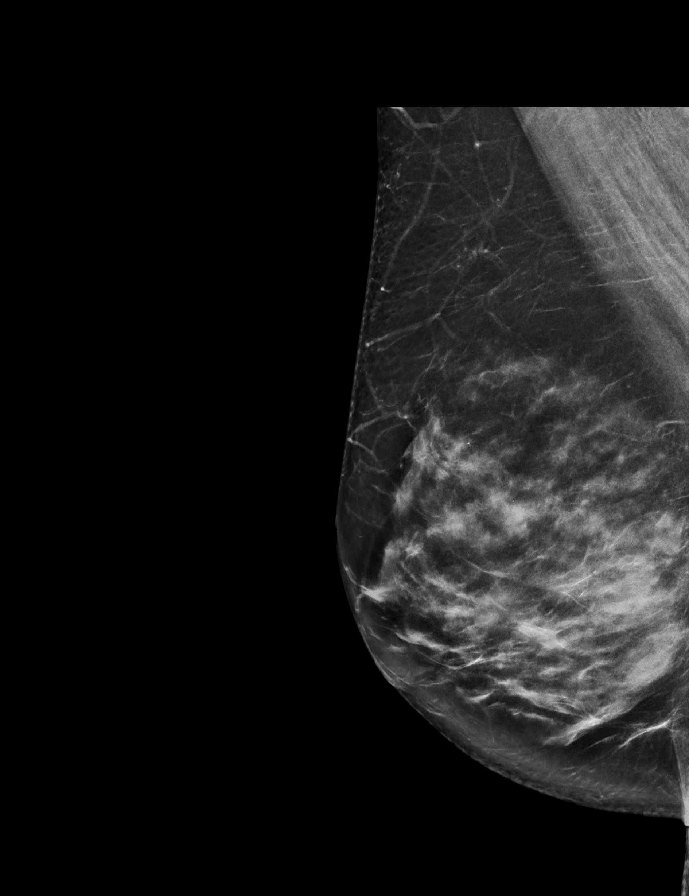

[L CC synth-2D]
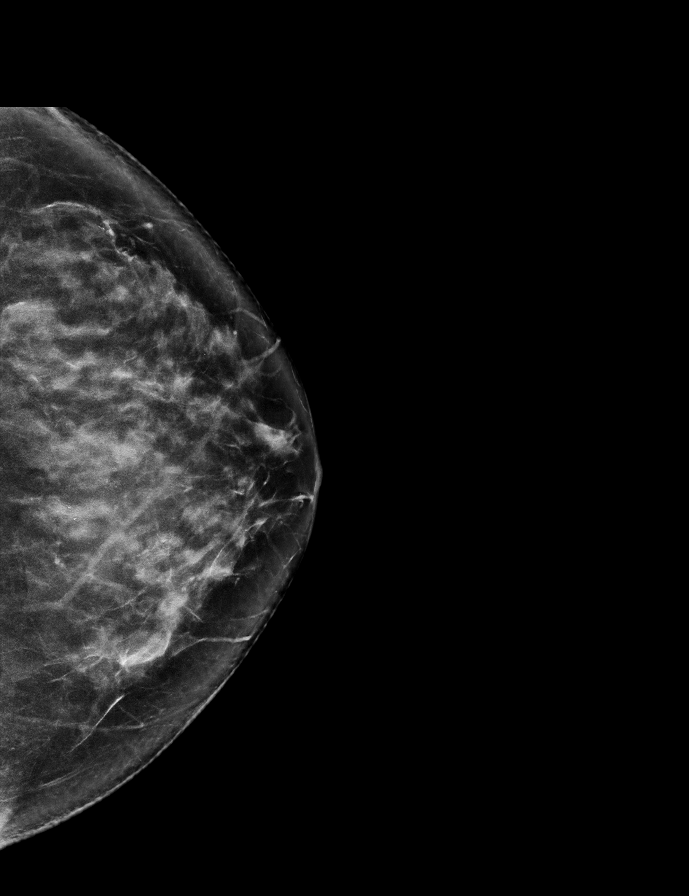

[R CC synth-2D]
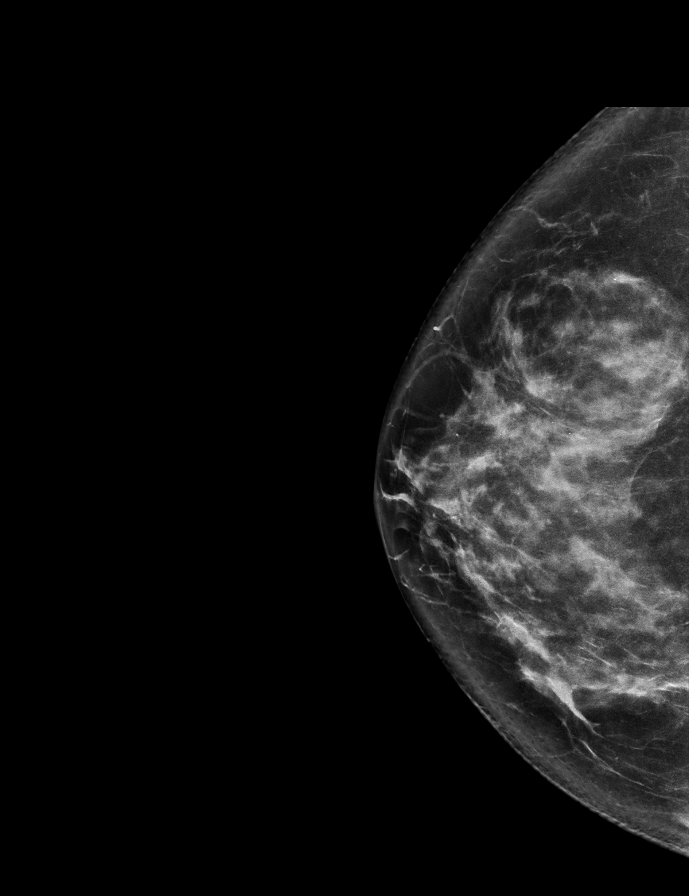

[L MLO synth-2D]
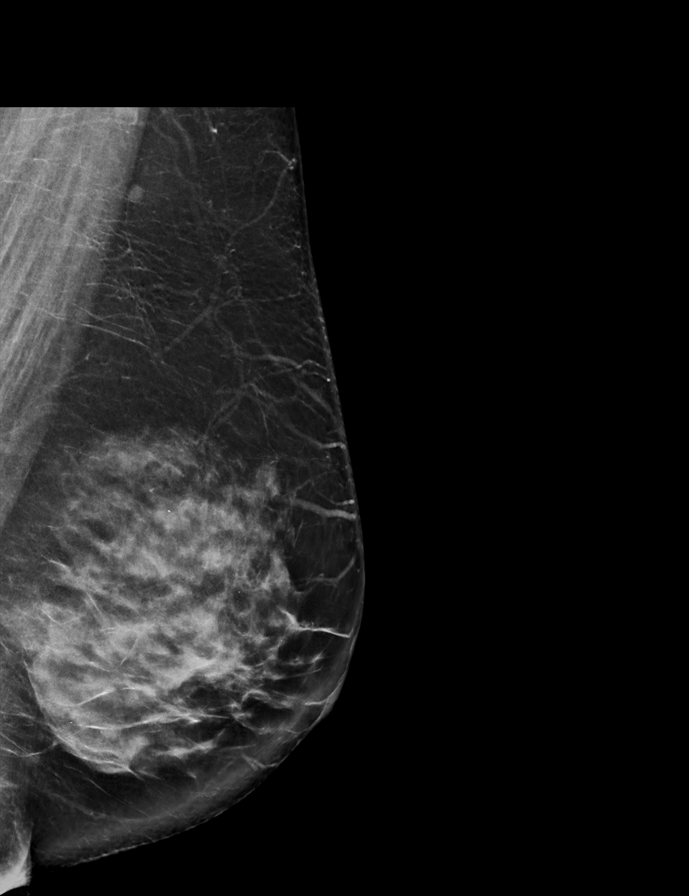

[L MLO tomo · 2 of 83 frames shown]
[frame 27/83]
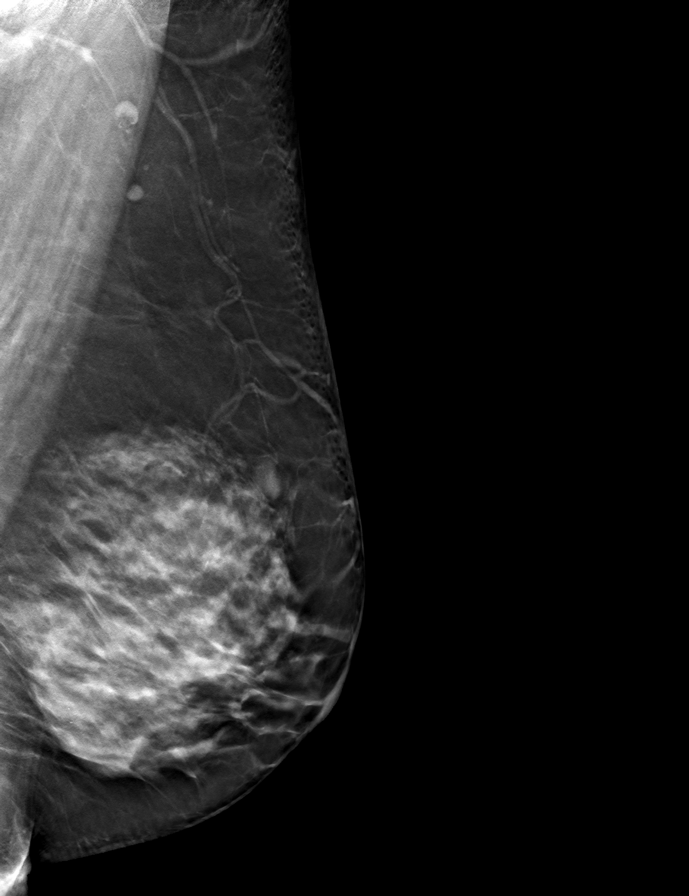
[frame 42/83]
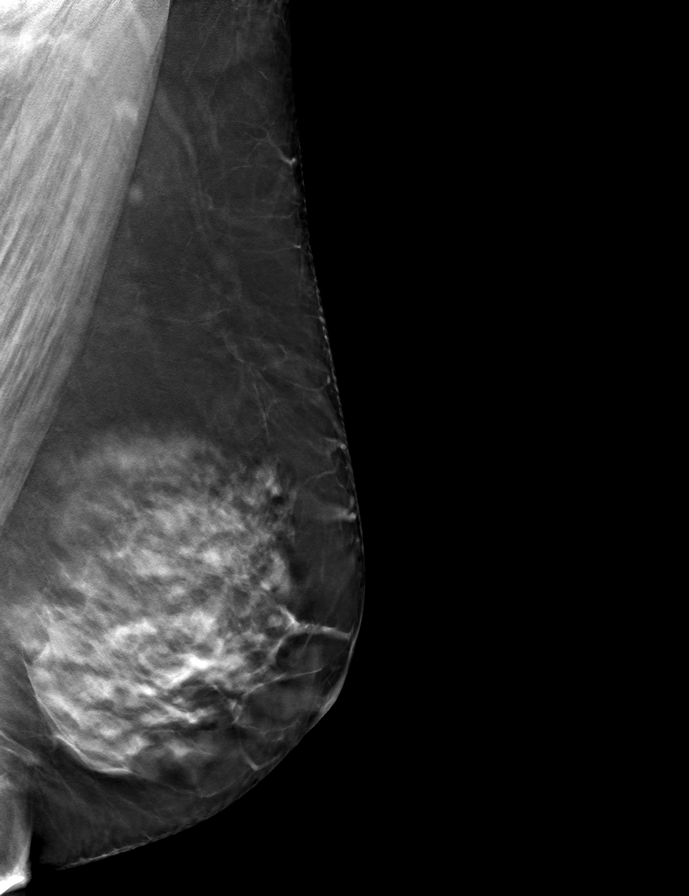

[L CC tomo · tomo slice 37/72.0]
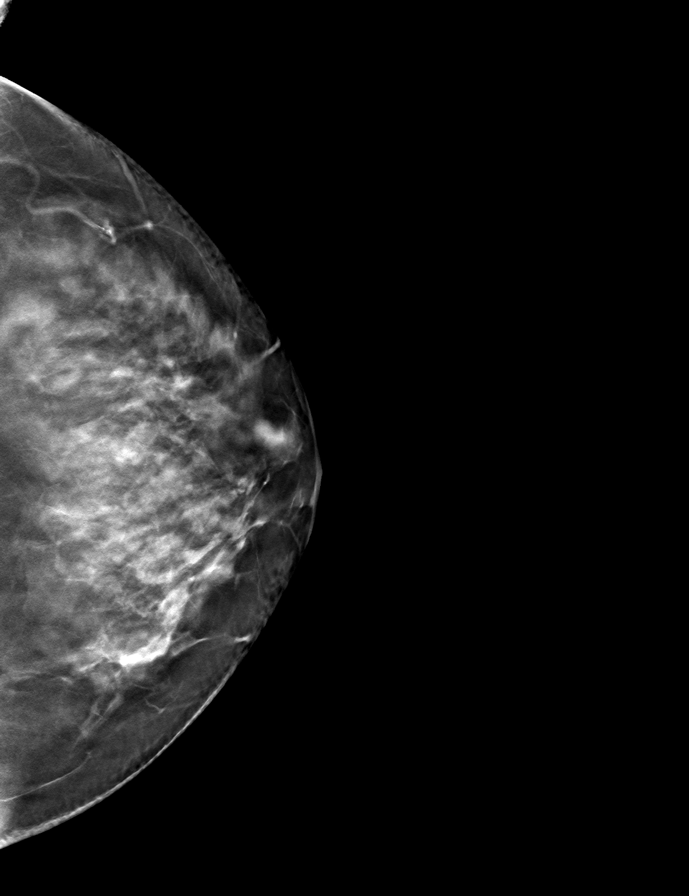

[R CC tomo · tomo slice 37/72.0]
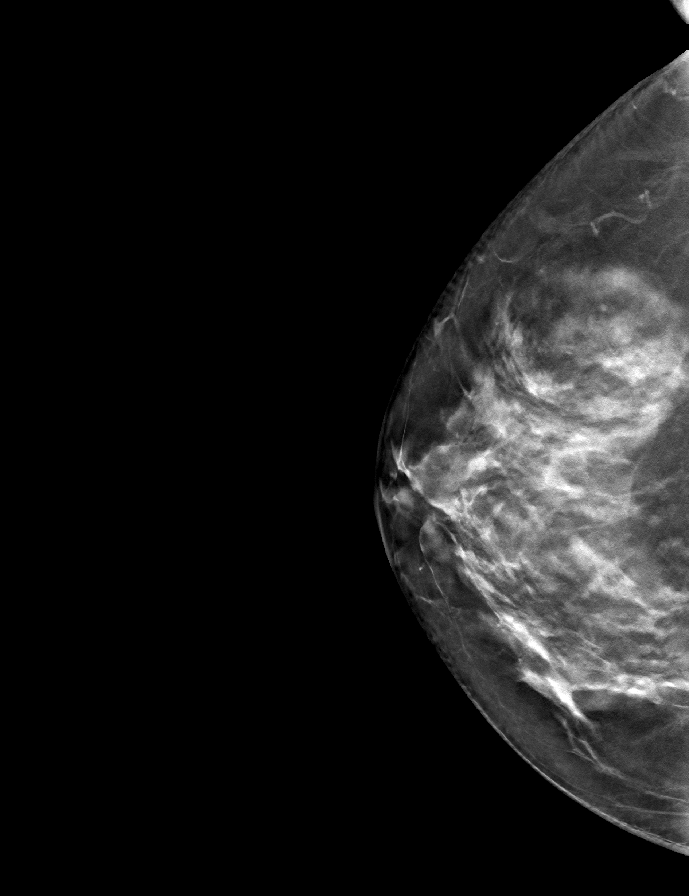

[R MLO tomo · tomo slice 40/79.0]
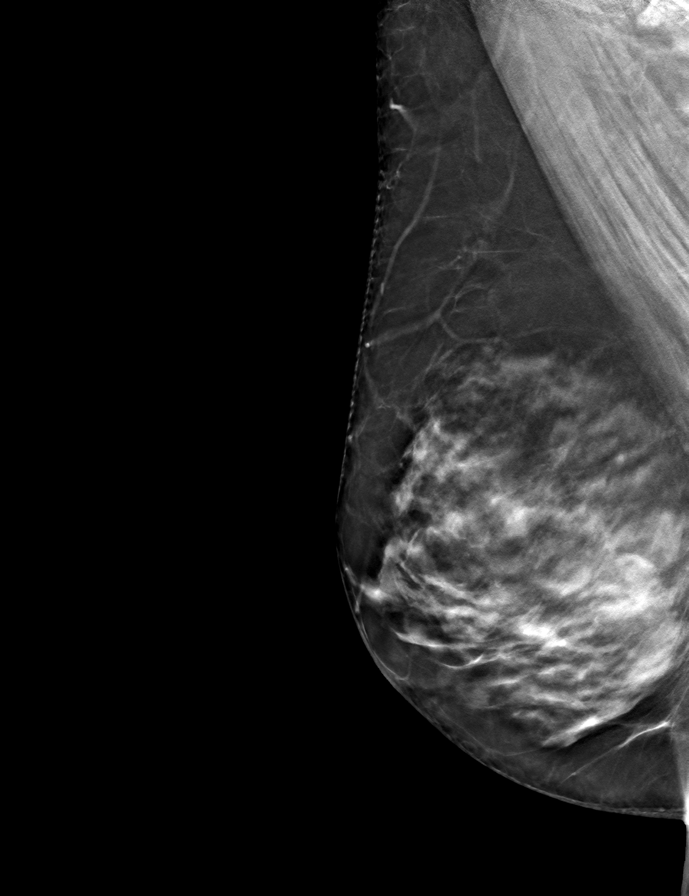

[9 of 24 positions shown; findings below may reference images not displayed]

ACR Breast Density Category c: The breast tissue is heterogeneously
dense, which may obscure small masses.
FINDINGS: There are no findings suspicious for malignancy. The images were
evaluated with computer-aided detection.
IMPRESSION: No mammographic evidence of malignancy. A result letter of this
screening mammogram will be mailed directly to the patient.

RECOMMENDATION:
Screening mammogram in one year. (Code:T4-5-GWO)

BI-RADS CATEGORY  1: Negative.

## 2022-12-17 DIAGNOSIS — R7309 Other abnormal glucose: Secondary | ICD-10-CM | POA: Diagnosis not present

## 2023-01-01 DIAGNOSIS — J40 Bronchitis, not specified as acute or chronic: Secondary | ICD-10-CM | POA: Diagnosis not present

## 2023-01-01 DIAGNOSIS — R051 Acute cough: Secondary | ICD-10-CM | POA: Diagnosis not present

## 2023-01-01 DIAGNOSIS — R059 Cough, unspecified: Secondary | ICD-10-CM | POA: Diagnosis not present

## 2023-01-17 DIAGNOSIS — R7309 Other abnormal glucose: Secondary | ICD-10-CM | POA: Diagnosis not present

## 2023-02-08 DIAGNOSIS — Z79899 Other long term (current) drug therapy: Secondary | ICD-10-CM | POA: Diagnosis not present

## 2023-02-08 DIAGNOSIS — I10 Essential (primary) hypertension: Secondary | ICD-10-CM | POA: Diagnosis not present

## 2023-02-08 DIAGNOSIS — R7303 Prediabetes: Secondary | ICD-10-CM | POA: Diagnosis not present

## 2023-02-08 DIAGNOSIS — E876 Hypokalemia: Secondary | ICD-10-CM | POA: Diagnosis not present

## 2023-02-08 DIAGNOSIS — K219 Gastro-esophageal reflux disease without esophagitis: Secondary | ICD-10-CM | POA: Diagnosis not present

## 2023-02-15 DIAGNOSIS — K219 Gastro-esophageal reflux disease without esophagitis: Secondary | ICD-10-CM | POA: Diagnosis not present

## 2023-02-15 DIAGNOSIS — G43909 Migraine, unspecified, not intractable, without status migrainosus: Secondary | ICD-10-CM | POA: Diagnosis not present

## 2023-02-15 DIAGNOSIS — I10 Essential (primary) hypertension: Secondary | ICD-10-CM | POA: Diagnosis not present

## 2023-02-15 DIAGNOSIS — Z0000 Encounter for general adult medical examination without abnormal findings: Secondary | ICD-10-CM | POA: Diagnosis not present

## 2023-02-16 DIAGNOSIS — R7309 Other abnormal glucose: Secondary | ICD-10-CM | POA: Diagnosis not present

## 2023-03-07 DIAGNOSIS — L821 Other seborrheic keratosis: Secondary | ICD-10-CM | POA: Diagnosis not present

## 2023-03-07 DIAGNOSIS — B078 Other viral warts: Secondary | ICD-10-CM | POA: Diagnosis not present

## 2023-03-07 DIAGNOSIS — L820 Inflamed seborrheic keratosis: Secondary | ICD-10-CM | POA: Diagnosis not present

## 2023-03-07 DIAGNOSIS — L720 Epidermal cyst: Secondary | ICD-10-CM | POA: Diagnosis not present

## 2023-03-19 DIAGNOSIS — R7309 Other abnormal glucose: Secondary | ICD-10-CM | POA: Diagnosis not present

## 2023-04-18 DIAGNOSIS — H402211 Chronic angle-closure glaucoma, right eye, mild stage: Secondary | ICD-10-CM | POA: Diagnosis not present

## 2023-04-18 DIAGNOSIS — R7309 Other abnormal glucose: Secondary | ICD-10-CM | POA: Diagnosis not present

## 2023-05-19 DIAGNOSIS — R7309 Other abnormal glucose: Secondary | ICD-10-CM | POA: Diagnosis not present

## 2023-06-01 ENCOUNTER — Other Ambulatory Visit: Payer: Self-pay | Admitting: Obstetrics and Gynecology

## 2023-06-01 DIAGNOSIS — Z1231 Encounter for screening mammogram for malignant neoplasm of breast: Secondary | ICD-10-CM

## 2023-06-02 DIAGNOSIS — Z1151 Encounter for screening for human papillomavirus (HPV): Secondary | ICD-10-CM | POA: Diagnosis not present

## 2023-06-02 DIAGNOSIS — Z124 Encounter for screening for malignant neoplasm of cervix: Secondary | ICD-10-CM | POA: Diagnosis not present

## 2023-06-02 DIAGNOSIS — Z01419 Encounter for gynecological examination (general) (routine) without abnormal findings: Secondary | ICD-10-CM | POA: Diagnosis not present

## 2023-06-02 DIAGNOSIS — Z6826 Body mass index (BMI) 26.0-26.9, adult: Secondary | ICD-10-CM | POA: Diagnosis not present

## 2023-06-14 ENCOUNTER — Ambulatory Visit: Payer: BC Managed Care – PPO

## 2023-06-16 ENCOUNTER — Ambulatory Visit
Admission: RE | Admit: 2023-06-16 | Discharge: 2023-06-16 | Disposition: A | Discharge status: Home / Self Care | Payer: BC Managed Care – PPO | Source: Ambulatory Visit | Attending: Obstetrics and Gynecology | Admitting: Obstetrics and Gynecology

## 2023-06-16 DIAGNOSIS — Z1231 Encounter for screening mammogram for malignant neoplasm of breast: Secondary | ICD-10-CM

## 2023-06-19 DIAGNOSIS — R7309 Other abnormal glucose: Secondary | ICD-10-CM | POA: Diagnosis not present

## 2023-06-22 DIAGNOSIS — M542 Cervicalgia: Secondary | ICD-10-CM | POA: Diagnosis not present

## 2023-07-05 ENCOUNTER — Ambulatory Visit: Payer: BC Managed Care – PPO | Admitting: Orthopaedic Surgery

## 2023-07-05 ENCOUNTER — Other Ambulatory Visit: Payer: Self-pay | Admitting: Orthopaedic Surgery

## 2023-07-05 ENCOUNTER — Other Ambulatory Visit: Payer: Self-pay

## 2023-07-05 ENCOUNTER — Encounter: Payer: Self-pay | Admitting: Orthopaedic Surgery

## 2023-07-05 VITALS — BP 126/63 | HR 37 | Ht 66.0 in | Wt 160.0 lb

## 2023-07-05 DIAGNOSIS — M542 Cervicalgia: Secondary | ICD-10-CM

## 2023-07-05 NOTE — Progress Notes (Signed)
Subjective:    Patient ID: [name], female    DOB: 1966/04/16, 57 y.o.   MRN: [id number]  HPI About a month ago she started having neck pain that would not go away.  It was more central and to the left side of her neck.  She did not have any paresthesias.  It continued and she used telehealth and had Mobic called in.  It helped only slightly.  She contacted Dr. Ouida Sills and he gave her diclofenac which helped but she still had the pain.  It sometimes goes to the right side of the neck at times.  She has no weakness, no trauma, no swelling, no redness.  She sleeps fairly well.  She has not changed her pillow.   Review of Systems  Constitutional:  Positive for activity change.  Musculoskeletal:  Positive for arthralgias and neck pain.  All other systems reviewed and are negative. For Review of Systems, all other systems reviewed and are negative.  The following is a summary of the past history medically, past history surgically, known current medicines, social history and family history.  This information is gathered electronically by the computer from prior information and documentation.  I review this each visit and have found including this information at this point in the chart is beneficial and informative.   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 Darrick Penna :moderate gastritis   ESOPHAGOGASTRODUODENOSCOPY N/A 11/23/2013   Dr. Domenic Schwab Gastritis in the body and the antrum of the stomach/ SINGLE diverticulum in the second portion of the duodenum, mild chronic gastritis on path. Negative H.pylori. Pursue HBT if persistent issues.    EYE SURGERY     glaucoma-unsure if both or just one of them   GLAUCOMA SURGERY  MAR 2012   IR RADIOLOGIST EVAL & MGMT  12/29/2017   TUBAL LIGATION     UPPER GASTROINTESTINAL ENDOSCOPY     OCT 2011 RING DILATED TO , CANDIDA ESOPHAGITIS, CHRONIC GASTRITIS    Current Outpatient Medications on File Prior to Visit  Medication Sig Dispense Refill   amitriptyline (ELAVIL) 25 MG tablet Take 25 mg by mouth at bedtime.     aspirin EC 81 MG tablet Take 81 mg by mouth every other day.     cetirizine (ZYRTEC ALLERGY) 10 MG tablet 1 tablet every 24 hours by oral route.     chlorthalidone (HYGROTON) 25 MG tablet Take 25 mg by mouth every morning.  cholecalciferol (VITAMIN D3) 25 MCG (1000 UT) tablet Take 1 tablet (1,000 Units total) by mouth daily.     dorzolamide-timolol (COSOPT) 22.3-6.8 MG/ML ophthalmic solution Place 1 drop into the left eye 2 (two) times daily.       latanoprost (XALATAN) 0.005 % ophthalmic solution Place 1 drop into the right eye at bedtime.      Omeprazole (PRILOSEC PO) Take by mouth.     valsartan (DIOVAN) 320 MG tablet Take 1 tablet by mouth at bedtime.     No current facility-administered medications on file prior to visit.    Social History   Socioeconomic History   Marital status: Married    Spouse name: Not on file   Number of children: 1   Years of education: Not on file   Highest education level:  Not on file  Occupational History   Occupation: buyer    Employer: [employer]  Tobacco Use   Smoking status: Never   Smokeless tobacco: Never  Vaping Use   Vaping status: Never Used  Substance and Sexual Activity   Alcohol use: No   Drug use: No   Sexual activity: Yes    Partners: Male    Birth control/protection: Surgical    Comment: spouse  Other Topics Concern   Not on file  Social History Narrative   Married.  Works at [employer].  One daughter born 1984   Social Determinants of Corporate investment banker Strain: Not on [employer] Insecurity: Not on file  Transportation Needs: Not on file  Physical Activity: Not on file  Stress: Not on file  Social Connections: Unknown (03/02/2022)   Received from [facility], [facility]   Social Network    Social Network: Not on file  Intimate Partner Violence: Unknown (01/22/2022)   Received from [facility], [facility]   HITS    Physically Hurt: Not on file    Insult or Talk Down To: Not on file    Threaten Physical Harm: Not on file    Scream or Curse: Not on file    Family History  Problem Relation Age of Onset   Heart disease Mother    Diabetes Maternal Grandmother    Stroke Paternal Grandmother    Heart disease Paternal Grandfather    Breast cancer Cousin        paternal cousin   Anesthesia problems Neg Hx    Hypotension Neg Hx    Malignant hyperthermia Neg Hx    Pseudochol deficiency Neg Hx     BP 126/63   Pulse (!) 37   Ht 5\' 6"  (1.676 m)   Wt 160 lb (72.6 kg)   LMP 08/09/2015 (Within Days) Comment: pt. having irregular period, perimenopausal  BMI 25.82 kg/m   Body mass index is 25.82 kg/m.      Objective:   Physical Exam Vitals and nursing note reviewed. Exam conducted with a chaperone present.  Constitutional:      Appearance: She is well-developed.  HENT:     Head: Normocephalic and atraumatic.  Eyes:     Conjunctiva/sclera: Conjunctivae normal.     Pupils: Pupils are equal, round, and  reactive to light.  Cardiovascular:     Rate and Rhythm: Normal rate and regular rhythm.  Pulmonary:     Effort: Pulmonary effort is normal.  Abdominal:     Palpations: Abdomen is soft.  Musculoskeletal:       Arms:     Cervical back: Normal range of motion and  neck supple.  Skin:    General: Skin is warm and dry.  Neurological:     Mental Status: She is alert and oriented to person, place, and time.     Cranial Nerves: No cranial nerve deficit.     Motor: No abnormal muscle tone.     Coordination: Coordination normal.     Deep Tendon Reflexes: Reflexes are normal and symmetric. Reflexes normal.  Psychiatric:        Behavior: Behavior normal.        Thought Content: Thought content normal.        Judgment: Judgment normal.   X-rays were done of the cervical spine, reported separately.        Assessment & Plan:   Encounter Diagnosis  Name Primary?   Neck pain Yes   I have shown her the X-rays with the narrowing and changes at C5-C6.  She is to continue the diclofenac.  She might need muscle relaxant at night but she is getting better and I will not prescribe now.  Return in one month.  Weather may affect it.  Pay attention to this.  Call if any problem.  Precautions discussed.  Electronically Signed Darreld Mclean, MD 9/17/20249:15 AM

## 2023-07-12 DIAGNOSIS — N3000 Acute cystitis without hematuria: Secondary | ICD-10-CM | POA: Diagnosis not present

## 2023-07-19 DIAGNOSIS — R7309 Other abnormal glucose: Secondary | ICD-10-CM | POA: Diagnosis not present

## 2023-08-02 ENCOUNTER — Ambulatory Visit: Payer: BC Managed Care – PPO | Admitting: Orthopaedic Surgery

## 2023-08-02 ENCOUNTER — Other Ambulatory Visit (INDEPENDENT_AMBULATORY_CARE_PROVIDER_SITE_OTHER): Payer: Self-pay

## 2023-08-02 ENCOUNTER — Encounter: Payer: Self-pay | Admitting: Orthopaedic Surgery

## 2023-08-02 ENCOUNTER — Telehealth: Payer: Self-pay

## 2023-08-02 VITALS — BP 144/77 | HR 51 | Ht 63.0 in | Wt 163.0 lb

## 2023-08-02 DIAGNOSIS — G8929 Other chronic pain: Secondary | ICD-10-CM | POA: Diagnosis not present

## 2023-08-02 DIAGNOSIS — M542 Cervicalgia: Secondary | ICD-10-CM

## 2023-08-02 DIAGNOSIS — M25562 Pain in left knee: Secondary | ICD-10-CM

## 2023-08-02 MED ORDER — DICLOFENAC SODIUM 75 MG PO TBEC: 75.0000 mg | DELAYED_RELEASE_TABLET | Freq: Two times a day (BID) | ORAL | 2 refills | Status: DC

## 2023-08-02 MED ORDER — DICLOFENAC SODIUM 75 MG PO TBEC: 75.0000 mg | DELAYED_RELEASE_TABLET | Freq: Two times a day (BID) | ORAL | 2 refills | Status: AC

## 2023-08-02 NOTE — Telephone Encounter (Signed)
complete

## 2023-08-02 NOTE — Progress Notes (Signed)
My knee is hurting now.  She has chronic neck pain with more pain in the left upper neck.  She has good and bad days.  Cooler weather makes it worse.  She has no paresthesias.  She is out of her diclofenac.  I will refill.  She has left knee pain with some popping and feeling it will give way but it has not yet.  She has more pain with steps and after walking a while.  She has no redness, no numbness.  Nothing seems to help it.  Neck has good ROM without spasm.  NV intact.  Left knee has near full ROM, no effusion, slight crepitus, slight medial to anterior pain, stable, NV intact, no distal edema.  X-rays were done of the left knee, reported separately.  Encounter Diagnoses  Name Primary?   Chronic pain of left knee Yes   Neck pain    I have told her to take the diclofenac for the knee.  I will see her in six weeks.  Call if any problem.  Precautions discussed.  Electronically Signed Darreld Mclean, MD 10/15/20248:34 AM

## 2023-08-19 DIAGNOSIS — R7309 Other abnormal glucose: Secondary | ICD-10-CM | POA: Diagnosis not present

## 2023-09-13 ENCOUNTER — Ambulatory Visit: Payer: BC Managed Care – PPO | Admitting: Orthopaedic Surgery

## 2023-09-18 DIAGNOSIS — R7309 Other abnormal glucose: Secondary | ICD-10-CM | POA: Diagnosis not present

## 2023-09-21 ENCOUNTER — Encounter: Payer: Self-pay | Admitting: Orthopaedic Surgery

## 2023-09-21 ENCOUNTER — Ambulatory Visit: Payer: BC Managed Care – PPO | Admitting: Orthopaedic Surgery

## 2023-09-21 VITALS — Ht 66.0 in | Wt 167.0 lb

## 2023-09-21 DIAGNOSIS — M542 Cervicalgia: Secondary | ICD-10-CM

## 2023-09-21 NOTE — Progress Notes (Signed)
My knee is OK but my neck hurts.  She has no pain in the left knee.  Her neck is painful after a longs day work.  She uses a computer sitting down. She has a desk that can adjust to a standup position but she does not use it.  She has localized neck pain, no paresthesias.   She takes the diclofenac occasionally.  ROM is full of the neck. NV intact.  Encounter Diagnosis  Name Primary?   Neck pain Yes   I have told her to have a coworker take a picture of her at her desk candidly and let her review it and see how her posture actually is.  Adjust her screen monitor to the correct height and the keyboard also.  Magnify the information on the monitor if needed to avoid bending forward.  I have given other pointers as well.  Return in six weeks.  Call if any problem.  Precautions discussed.  Electronically Signed Darreld Mclean, MD 12/4/20248:24 AM

## 2023-10-19 DIAGNOSIS — R7309 Other abnormal glucose: Secondary | ICD-10-CM | POA: Diagnosis not present

## 2023-11-09 ENCOUNTER — Ambulatory Visit: Payer: BC Managed Care – PPO | Admitting: Orthopaedic Surgery

## 2023-11-19 DIAGNOSIS — R7309 Other abnormal glucose: Secondary | ICD-10-CM | POA: Diagnosis not present

## 2023-12-14 ENCOUNTER — Encounter: Payer: Self-pay | Admitting: Orthopaedic Surgery

## 2023-12-14 ENCOUNTER — Ambulatory Visit: Payer: BC Managed Care – PPO | Admitting: Orthopaedic Surgery

## 2023-12-14 VITALS — BP 144/68 | HR 56

## 2023-12-14 DIAGNOSIS — M542 Cervicalgia: Secondary | ICD-10-CM

## 2023-12-14 NOTE — Progress Notes (Signed)
 I feel much better.  Her neck pain is much improved.  She has changed her positioning at work and that helped significantly.  She still has some days with pain but far and few between.  She has no new trauma, no weakness.  Neck has full ROM.  NV intact.    Encounter Diagnosis  Name Primary?   Neck pain Yes   I will see her as needed.  Call if any problem.  Precautions discussed.  Electronically Signed Darreld Mclean, MD 2/26/20258:17 AM

## 2023-12-17 DIAGNOSIS — R7309 Other abnormal glucose: Secondary | ICD-10-CM | POA: Diagnosis not present

## 2024-01-17 DIAGNOSIS — R7309 Other abnormal glucose: Secondary | ICD-10-CM | POA: Diagnosis not present

## 2024-02-13 DIAGNOSIS — K219 Gastro-esophageal reflux disease without esophagitis: Secondary | ICD-10-CM | POA: Diagnosis not present

## 2024-02-13 DIAGNOSIS — Z79899 Other long term (current) drug therapy: Secondary | ICD-10-CM | POA: Diagnosis not present

## 2024-02-13 DIAGNOSIS — G43909 Migraine, unspecified, not intractable, without status migrainosus: Secondary | ICD-10-CM | POA: Diagnosis not present

## 2024-02-13 DIAGNOSIS — R7301 Impaired fasting glucose: Secondary | ICD-10-CM | POA: Diagnosis not present

## 2024-02-13 DIAGNOSIS — I10 Essential (primary) hypertension: Secondary | ICD-10-CM | POA: Diagnosis not present

## 2024-02-16 DIAGNOSIS — G43001 Migraine without aura, not intractable, with status migrainosus: Secondary | ICD-10-CM | POA: Diagnosis not present

## 2024-02-16 DIAGNOSIS — R7309 Other abnormal glucose: Secondary | ICD-10-CM | POA: Diagnosis not present

## 2024-02-16 DIAGNOSIS — E876 Hypokalemia: Secondary | ICD-10-CM | POA: Diagnosis not present

## 2024-02-16 DIAGNOSIS — Z0001 Encounter for general adult medical examination with abnormal findings: Secondary | ICD-10-CM | POA: Diagnosis not present

## 2024-02-16 DIAGNOSIS — I10 Essential (primary) hypertension: Secondary | ICD-10-CM | POA: Diagnosis not present

## 2024-03-18 DIAGNOSIS — R7309 Other abnormal glucose: Secondary | ICD-10-CM | POA: Diagnosis not present

## 2024-04-12 DIAGNOSIS — H402211 Chronic angle-closure glaucoma, right eye, mild stage: Secondary | ICD-10-CM | POA: Diagnosis not present

## 2024-04-17 DIAGNOSIS — R7309 Other abnormal glucose: Secondary | ICD-10-CM | POA: Diagnosis not present

## 2024-05-18 DIAGNOSIS — R7309 Other abnormal glucose: Secondary | ICD-10-CM | POA: Diagnosis not present

## 2024-06-08 ENCOUNTER — Encounter: Payer: Self-pay | Admitting: Radiology

## 2024-06-08 DIAGNOSIS — Z1151 Encounter for screening for human papillomavirus (HPV): Secondary | ICD-10-CM | POA: Diagnosis not present

## 2024-06-08 DIAGNOSIS — Z01419 Encounter for gynecological examination (general) (routine) without abnormal findings: Secondary | ICD-10-CM | POA: Diagnosis not present

## 2024-06-08 DIAGNOSIS — Z6826 Body mass index (BMI) 26.0-26.9, adult: Secondary | ICD-10-CM | POA: Diagnosis not present

## 2024-06-08 DIAGNOSIS — Z124 Encounter for screening for malignant neoplasm of cervix: Secondary | ICD-10-CM | POA: Diagnosis not present

## 2024-06-18 DIAGNOSIS — R7309 Other abnormal glucose: Secondary | ICD-10-CM | POA: Diagnosis not present

## 2024-07-18 DIAGNOSIS — R7309 Other abnormal glucose: Secondary | ICD-10-CM | POA: Diagnosis not present

## 2024-07-22 DIAGNOSIS — L247 Irritant contact dermatitis due to plants, except food: Secondary | ICD-10-CM | POA: Diagnosis not present

## 2024-07-25 DIAGNOSIS — L237 Allergic contact dermatitis due to plants, except food: Secondary | ICD-10-CM | POA: Diagnosis not present

## 2024-07-25 DIAGNOSIS — L0390 Cellulitis, unspecified: Secondary | ICD-10-CM | POA: Diagnosis not present

## 2024-08-05 DIAGNOSIS — K5900 Constipation, unspecified: Secondary | ICD-10-CM | POA: Diagnosis not present

## 2024-08-05 DIAGNOSIS — R030 Elevated blood-pressure reading, without diagnosis of hypertension: Secondary | ICD-10-CM | POA: Diagnosis not present

## 2024-08-05 DIAGNOSIS — K819 Cholecystitis, unspecified: Secondary | ICD-10-CM | POA: Diagnosis not present

## 2024-08-05 DIAGNOSIS — K2970 Gastritis, unspecified, without bleeding: Secondary | ICD-10-CM | POA: Diagnosis not present

## 2024-08-15 DIAGNOSIS — E876 Hypokalemia: Secondary | ICD-10-CM | POA: Diagnosis not present

## 2024-08-18 DIAGNOSIS — R7309 Other abnormal glucose: Secondary | ICD-10-CM | POA: Diagnosis not present

## 2024-08-20 ENCOUNTER — Encounter: Payer: Self-pay | Admitting: Radiology

## 2024-08-22 DIAGNOSIS — I10 Essential (primary) hypertension: Secondary | ICD-10-CM | POA: Diagnosis not present

## 2024-08-22 DIAGNOSIS — E876 Hypokalemia: Secondary | ICD-10-CM | POA: Diagnosis not present
